# Patient Record
Sex: Male | Born: 1950 | Race: White | Hispanic: No | Marital: Married | Smoking: Current every day smoker
Health system: Southern US, Community
[De-identification: ages and names within clinical notes are randomized; demographics above are authoritative.]

## PROBLEM LIST (undated history)

## (undated) DIAGNOSIS — N200 Calculus of kidney: Secondary | ICD-10-CM

## (undated) DIAGNOSIS — K5792 Diverticulitis of intestine, part unspecified, without perforation or abscess without bleeding: Secondary | ICD-10-CM

## (undated) DIAGNOSIS — K8020 Calculus of gallbladder without cholecystitis without obstruction: Secondary | ICD-10-CM

## (undated) DIAGNOSIS — I82409 Acute embolism and thrombosis of unspecified deep veins of unspecified lower extremity: Secondary | ICD-10-CM

## (undated) DIAGNOSIS — I2699 Other pulmonary embolism without acute cor pulmonale: Secondary | ICD-10-CM

## (undated) HISTORY — PX: IVC FILTER PLACEMENT (ARMC HX): HXRAD1551

## (undated) HISTORY — PX: FOOT SURGERY: SHX648

---

## 2005-10-03 ENCOUNTER — Ambulatory Visit: Payer: Self-pay | Admitting: Podiatry

## 2006-11-03 ENCOUNTER — Emergency Department: Payer: Self-pay | Admitting: Emergency Medicine

## 2014-07-13 ENCOUNTER — Inpatient Hospital Stay: Payer: Self-pay | Admitting: Internal Medicine

## 2014-07-13 LAB — URINALYSIS, COMPLETE
Bacteria: NONE SEEN
Bilirubin,UR: NEGATIVE
GLUCOSE, UR: NEGATIVE mg/dL (ref 0–75)
Leukocyte Esterase: NEGATIVE
Nitrite: NEGATIVE
PH: 5 (ref 4.5–8.0)
Protein: 30
RBC,UR: 372 /HPF (ref 0–5)
SQUAMOUS EPITHELIAL: NONE SEEN
Specific Gravity: >1.06 (ref 1.003–1.030)
WBC UR: NONE SEEN /HPF (ref 0–5)

## 2014-07-13 LAB — CBC
HCT: 38.1 % — ABNORMAL LOW (ref 40.0–52.0)
HGB: 12.7 g/dL — ABNORMAL LOW (ref 13.0–18.0)
MCH: 29.6 pg (ref 26.0–34.0)
MCHC: 33.2 g/dL (ref 32.0–36.0)
MCV: 89 fL (ref 80–100)
PLATELETS: 224 10*3/uL (ref 150–440)
RBC: 4.27 10*6/uL — ABNORMAL LOW (ref 4.40–5.90)
RDW: 14.7 % — ABNORMAL HIGH (ref 11.5–14.5)
WBC: 10.4 10*3/uL (ref 3.8–10.6)

## 2014-07-13 LAB — COMPREHENSIVE METABOLIC PANEL
ALT: 24 U/L
ANION GAP: 7 (ref 7–16)
Albumin: 3.2 g/dL — ABNORMAL LOW (ref 3.4–5.0)
Alkaline Phosphatase: 125 U/L — ABNORMAL HIGH
BUN: 12 mg/dL (ref 7–18)
Bilirubin,Total: 1 mg/dL (ref 0.2–1.0)
CREATININE: 0.84 mg/dL (ref 0.60–1.30)
Calcium, Total: 8.4 mg/dL — ABNORMAL LOW (ref 8.5–10.1)
Chloride: 110 mmol/L — ABNORMAL HIGH (ref 98–107)
Co2: 25 mmol/L (ref 21–32)
EGFR (African American): >60
EGFR (Non-African Amer.): >60
GLUCOSE: 106 mg/dL — AB (ref 65–99)
Osmolality: 283 (ref 275–301)
Potassium: 3.9 mmol/L (ref 3.5–5.1)
SGOT(AST): 23 U/L (ref 15–37)
Sodium: 142 mmol/L (ref 136–145)
Total Protein: 6.8 g/dL (ref 6.4–8.2)

## 2014-07-13 LAB — PROTIME-INR
INR: 1
Prothrombin Time: 13 secs (ref 11.5–14.7)

## 2014-07-13 LAB — LIPASE, BLOOD: Lipase: 80 U/L (ref 73–393)

## 2014-07-13 LAB — TROPONIN I: Troponin-I: <0.02 ng/mL

## 2014-07-13 LAB — APTT: Activated PTT: 26.4 secs (ref 23.6–35.9)

## 2014-07-14 LAB — BASIC METABOLIC PANEL
ANION GAP: 6 — AB (ref 7–16)
BUN: 11 mg/dL (ref 7–18)
CO2: 27 mmol/L (ref 21–32)
Calcium, Total: 7.7 mg/dL — ABNORMAL LOW (ref 8.5–10.1)
Chloride: 109 mmol/L — ABNORMAL HIGH (ref 98–107)
Creatinine: 0.85 mg/dL (ref 0.60–1.30)
EGFR (African American): >60
EGFR (Non-African Amer.): >60
Glucose: 94 mg/dL (ref 65–99)
Osmolality: 282 (ref 275–301)
Potassium: 4 mmol/L (ref 3.5–5.1)
Sodium: 142 mmol/L (ref 136–145)

## 2014-07-14 LAB — CBC WITH DIFFERENTIAL/PLATELET
BASOS ABS: 0.1 10*3/uL (ref 0.0–0.1)
BASOS PCT: 0.6 %
BASOS PCT: 0.2 %
Basophil #: 0.1 10*3/uL (ref 0.0–0.1)
Basophil #: 0 10*3/uL (ref 0.0–0.1)
Basophil %: 0.6 %
Eosinophil #: 0.1 10*3/uL (ref 0.0–0.7)
Eosinophil #: 0.1 10*3/uL (ref 0.0–0.7)
Eosinophil #: 0.1 10*3/uL (ref 0.0–0.7)
Eosinophil %: 0.9 %
Eosinophil %: 1.5 %
Eosinophil %: 0.9 %
HCT: 33.5 % — AB (ref 40.0–52.0)
HCT: 33.5 % — ABNORMAL LOW (ref 40.0–52.0)
HCT: 32.8 % — ABNORMAL LOW (ref 40.0–52.0)
HGB: 11.3 g/dL — AB (ref 13.0–18.0)
HGB: 10.7 g/dL — ABNORMAL LOW (ref 13.0–18.0)
HGB: 10.6 g/dL — ABNORMAL LOW (ref 13.0–18.0)
LYMPHS PCT: 12.6 %
Lymphocyte #: 1.6 10*3/uL (ref 1.0–3.6)
Lymphocyte #: 1.2 10*3/uL (ref 1.0–3.6)
Lymphocyte #: 1.5 10*3/uL (ref 1.0–3.6)
Lymphocyte %: 16.2 %
Lymphocyte %: 14.6 %
MCH: 30.1 pg (ref 26.0–34.0)
MCH: 29.2 pg (ref 26.0–34.0)
MCH: 29.5 pg (ref 26.0–34.0)
MCHC: 33.7 g/dL (ref 32.0–36.0)
MCHC: 31.8 g/dL — AB (ref 32.0–36.0)
MCHC: 32.5 g/dL (ref 32.0–36.0)
MCV: 89 fL (ref 80–100)
MCV: 92 fL (ref 80–100)
MCV: 91 fL (ref 80–100)
MONOS PCT: 7.9 %
MONOS PCT: 7 %
Monocyte #: 0.8 x10 3/mm (ref 0.2–1.0)
Monocyte #: 0.6 x10 3/mm (ref 0.2–1.0)
Monocyte #: 0.8 x10 3/mm (ref 0.2–1.0)
Monocyte %: 6.8 %
NEUTROS ABS: 7.1 10*3/uL — AB (ref 1.4–6.5)
NEUTROS ABS: 6.2 10*3/uL (ref 1.4–6.5)
NEUTROS ABS: 9.2 10*3/uL — AB (ref 1.4–6.5)
NEUTROS PCT: 79.3 %
Neutrophil %: 74.4 %
Neutrophil %: 76.5 %
PLATELETS: 178 10*3/uL (ref 150–440)
Platelet: 173 10*3/uL (ref 150–440)
Platelet: 179 10*3/uL (ref 150–440)
RBC: 3.75 10*6/uL — ABNORMAL LOW (ref 4.40–5.90)
RBC: 3.66 10*6/uL — ABNORMAL LOW (ref 4.40–5.90)
RBC: 3.61 10*6/uL — ABNORMAL LOW (ref 4.40–5.90)
RDW: 14.7 % — AB (ref 11.5–14.5)
RDW: 15 % — ABNORMAL HIGH (ref 11.5–14.5)
RDW: 14.6 % — AB (ref 11.5–14.5)
WBC: 9.6 10*3/uL (ref 3.8–10.6)
WBC: 8.1 10*3/uL (ref 3.8–10.6)
WBC: 11.5 10*3/uL — ABNORMAL HIGH (ref 3.8–10.6)

## 2014-07-14 LAB — LIPID PANEL
Cholesterol: 136 mg/dL (ref 0–200)
HDL Cholesterol: 38 mg/dL — ABNORMAL LOW (ref 40–60)
LDL CHOLESTEROL, CALC: 83 mg/dL (ref 0–100)
TRIGLYCERIDES: 76 mg/dL (ref 0–200)
VLDL CHOLESTEROL, CALC: 15 mg/dL (ref 5–40)

## 2014-07-14 LAB — TROPONIN I: Troponin-I: <0.02 ng/mL

## 2014-07-14 LAB — HEMATOCRIT: HCT: 33.1 % — ABNORMAL LOW (ref 40.0–52.0)

## 2014-07-15 LAB — BASIC METABOLIC PANEL
ANION GAP: 8 (ref 7–16)
BUN: 7 mg/dL (ref 7–18)
Calcium, Total: 7.7 mg/dL — ABNORMAL LOW (ref 8.5–10.1)
Chloride: 109 mmol/L — ABNORMAL HIGH (ref 98–107)
Co2: 24 mmol/L (ref 21–32)
Creatinine: 0.73 mg/dL (ref 0.60–1.30)
EGFR (Non-African Amer.): >60
GLUCOSE: 73 mg/dL (ref 65–99)
Osmolality: 278 (ref 275–301)
POTASSIUM: 3.5 mmol/L (ref 3.5–5.1)
SODIUM: 141 mmol/L (ref 136–145)

## 2014-07-15 LAB — CBC WITH DIFFERENTIAL/PLATELET
BASOS ABS: 0 10*3/uL (ref 0.0–0.1)
Basophil %: 0.3 %
EOS ABS: 0.2 10*3/uL (ref 0.0–0.7)
Eosinophil %: 2.3 %
HCT: 31.3 % — ABNORMAL LOW (ref 40.0–52.0)
HGB: 10.5 g/dL — AB (ref 13.0–18.0)
LYMPHS ABS: 1.3 10*3/uL (ref 1.0–3.6)
Lymphocyte %: 16.9 %
MCH: 30.2 pg (ref 26.0–34.0)
MCHC: 33.7 g/dL (ref 32.0–36.0)
MCV: 90 fL (ref 80–100)
MONO ABS: 0.7 x10 3/mm (ref 0.2–1.0)
MONOS PCT: 8.6 %
NEUTROS ABS: 5.5 10*3/uL (ref 1.4–6.5)
Neutrophil %: 71.9 %
PLATELETS: 173 10*3/uL (ref 150–440)
RBC: 3.48 10*6/uL — ABNORMAL LOW (ref 4.40–5.90)
RDW: 14.9 % — ABNORMAL HIGH (ref 11.5–14.5)
WBC: 7.7 10*3/uL (ref 3.8–10.6)

## 2014-07-15 LAB — PSA: PSA: 0.4 ng/mL (ref 0.0–4.0)

## 2014-07-15 LAB — CEA: CEA: 1 ng/mL (ref 0.0–4.7)

## 2014-07-15 LAB — HEMATOCRIT: HCT: 32 % — ABNORMAL LOW (ref 40.0–52.0)

## 2014-07-15 LAB — CANCER ANTIGEN 19-9: CA 19 9: 8 U/mL (ref 0–35)

## 2014-07-16 LAB — BASIC METABOLIC PANEL
Anion Gap: 9 (ref 7–16)
BUN: 6 mg/dL — ABNORMAL LOW (ref 7–18)
CALCIUM: 7.8 mg/dL — AB (ref 8.5–10.1)
CHLORIDE: 108 mmol/L — AB (ref 98–107)
Co2: 24 mmol/L (ref 21–32)
Creatinine: 0.69 mg/dL (ref 0.60–1.30)
EGFR (Non-African Amer.): >60
GLUCOSE: 68 mg/dL (ref 65–99)
OSMOLALITY: 277 (ref 275–301)
POTASSIUM: 3.6 mmol/L (ref 3.5–5.1)
SODIUM: 141 mmol/L (ref 136–145)

## 2014-07-16 LAB — CBC WITH DIFFERENTIAL/PLATELET
BASOS ABS: 0 10*3/uL (ref 0.0–0.1)
BASOS PCT: 0.4 %
Eosinophil #: 0.3 10*3/uL (ref 0.0–0.7)
Eosinophil %: 3.9 %
HCT: 31.6 % — AB (ref 40.0–52.0)
HGB: 10.7 g/dL — ABNORMAL LOW (ref 13.0–18.0)
LYMPHS PCT: 20.3 %
Lymphocyte #: 1.4 10*3/uL (ref 1.0–3.6)
MCH: 30.1 pg (ref 26.0–34.0)
MCHC: 33.8 g/dL (ref 32.0–36.0)
MCV: 89 fL (ref 80–100)
MONO ABS: 0.6 x10 3/mm (ref 0.2–1.0)
Monocyte %: 9.2 %
NEUTROS ABS: 4.6 10*3/uL (ref 1.4–6.5)
NEUTROS PCT: 66.2 %
Platelet: 184 10*3/uL (ref 150–440)
RBC: 3.54 10*6/uL — AB (ref 4.40–5.90)
RDW: 14.5 % (ref 11.5–14.5)
WBC: 7 10*3/uL (ref 3.8–10.6)

## 2014-07-16 LAB — URINE CULTURE

## 2014-07-18 LAB — CULTURE, BLOOD (SINGLE)

## 2014-07-23 ENCOUNTER — Other Ambulatory Visit: Payer: Self-pay | Admitting: Surgery

## 2014-07-23 LAB — CBC WITH DIFFERENTIAL/PLATELET
BASOS ABS: 0.1 10*3/uL (ref 0.0–0.1)
Basophil %: 0.5 %
Eosinophil #: 0.2 10*3/uL (ref 0.0–0.7)
Eosinophil %: 1.9 %
HCT: 36.3 % — AB (ref 40.0–52.0)
HGB: 11.8 g/dL — ABNORMAL LOW (ref 13.0–18.0)
LYMPHS ABS: 1.1 10*3/uL (ref 1.0–3.6)
Lymphocyte %: 10 %
MCH: 29.1 pg (ref 26.0–34.0)
MCHC: 32.4 g/dL (ref 32.0–36.0)
MCV: 90 fL (ref 80–100)
MONOS PCT: 10.1 %
Monocyte #: 1.1 x10 3/mm — ABNORMAL HIGH (ref 0.2–1.0)
Neutrophil #: 8.4 10*3/uL — ABNORMAL HIGH (ref 1.4–6.5)
Neutrophil %: 77.5 %
PLATELETS: 396 10*3/uL (ref 150–440)
RBC: 4.05 10*6/uL — AB (ref 4.40–5.90)
RDW: 14.8 % — ABNORMAL HIGH (ref 11.5–14.5)
WBC: 10.8 10*3/uL — ABNORMAL HIGH (ref 3.8–10.6)

## 2014-07-23 LAB — COMPREHENSIVE METABOLIC PANEL
ALK PHOS: 97 U/L
ALT: 20 U/L
ANION GAP: 8 (ref 7–16)
Albumin: 3.5 g/dL (ref 3.4–5.0)
BUN: 9 mg/dL (ref 7–18)
Bilirubin,Total: 1.6 mg/dL — ABNORMAL HIGH (ref 0.2–1.0)
CALCIUM: 8.8 mg/dL (ref 8.5–10.1)
CO2: 27 mmol/L (ref 21–32)
Chloride: 103 mmol/L (ref 98–107)
Creatinine: 0.76 mg/dL (ref 0.60–1.30)
EGFR (African American): >60
EGFR (Non-African Amer.): >60
Glucose: 91 mg/dL (ref 65–99)
OSMOLALITY: 274 (ref 275–301)
Potassium: 3.6 mmol/L (ref 3.5–5.1)
SGOT(AST): 33 U/L (ref 15–37)
Sodium: 138 mmol/L (ref 136–145)
Total Protein: 7.8 g/dL (ref 6.4–8.2)

## 2014-07-26 ENCOUNTER — Ambulatory Visit: Payer: Self-pay | Admitting: Surgery

## 2014-10-04 ENCOUNTER — Ambulatory Visit: Payer: Self-pay | Admitting: Vascular Surgery

## 2014-10-08 ENCOUNTER — Ambulatory Visit: Payer: Self-pay | Admitting: Gastroenterology

## 2015-04-02 NOTE — Consult Note (Signed)
Brief Consult Note: Diagnosis: dvt, pe, sigmoid perforation/mass, anemia.   Patient was seen by consultant.   Comments: PATIENT SEN CHART REVIEWED DICTATED NOTE TO FOLLOW..POSSIBLE UNDERLYING  CANCER, HAS NEG PAST HX,MILD ANEMIA, CLOT AND PE...INFLAMMATORY LESION MAY HAVE PROMOTED CLOT, OR MALIGNAMCY, OR ACUIRED HYPERCOAGUABLE STATE, LESS LIKELY INHERITED COAGUABLE STATE, NO FAMILY HX OF MALIGNANCY OR CLOT.....Marland Kitchen.PLAN.Marland Kitchen.ACUTE MANAGMENT AS PER MEDICINE, VASCULAR, AND GENERAL SURGERY..   WILL LATER NEED F/U SCAN AND GI FOR COLONOSCOPY.  WOULD START W/U WITH CEA, CA 19/9, PSA, LUPUS INHIBITOR, IRON STUDIES. LATER, AS SOON AS OK WITH SURGERY, WOULD WANT TO GIVE THERAPEUTIC ANTICOAGULATION.  Electronic Signatures: Marin RobertsGittin, Robert G (MD)  (Signed 06-Aug-15 00:04)  Authored: Brief Consult Note   Last Updated: 06-Aug-15 00:04 by Marin RobertsGittin, Robert G (MD)

## 2015-04-02 NOTE — Discharge Summary (Signed)
PATIENT NAME:  Francisco FeelerHOBBY, Francisco Barnes MR#:  [id number] DATE OF BIRTH:  01-Jun-1951  DATE OF ADMISSION:  07/13/2014 DATE OF DISCHARGE:  07/18/2014  DISCHARGE DIAGNOSIS:  1.  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  2.  Pulmonary emboli.  3.  Perforated diverticulum.   DISCHARGE MEDICATIONS:  Aspirin 81 mg p.Barnes. daily, cranberry extract daily, fish oil 1 capsule b.i.d., Cipro 500 mg p.Barnes. b.i.d., Flagyl 500 mg every 8 hours, apixaban (Eliquis 5mg   2 tablets p.Barnes. 2 times daily for 7 days, then 5 mg tablet p.Barnes. b.i.d.  The patient initially needs to take 10 mg p.Barnes. b.i.d. for 1 week and after that 5 mg p.Barnes. b.i.d.  We discussed this and benefits of anticoagulation. The patient received Cipro for 10 days and also Flagyl for 2 weeks. The patient will follow up with Dr. Excell Seltzerooper as an outpatient in about 1 week regarding repeat CT abdomen.   LABORATORY DATA: The patient's electrolytes on admission: Sodium 142, potassium 3.9, chloride 110, bicarbonate 25, BUN 12, creatinine 0.8, glucose 106. LFTs within normal limits. INR is 1, white count is 10.4, hemoglobin 12.27, hematocrit 38.1, platelets 224,000.  Blood cultures are negative. Troponin is less than 0.02, white count 9.6, hemoglobin 11.3, hematocrit 33.8, platelets 173,000. The patient's lipids are within normal limits. Kidney function stayed stable. Hemoglobin stayed stable at 10.7.   Chest x-ray shows minute atelectasis in the left base bilaterally. CT angio chest shows small PE in the right lower lobe, negative aortic aneurysm or dissection. Left renal pelvis has 4-mm and 6-mm stones. The patient also has moderate ascites, high attenuation fluid in the pelvis, compatible with hemoperitoneum. Thickened sigmoid colon segment with edema in the surrounding mesenteric areass> bowel perforation or hemorrhage; diverticulitis is most likely.   The patient's urine showed 20,000 colonies of enterococci.   ct abdomen shows Moderate  ascites. Small liver with possible cirrhosis. No focal liver lesion however this is arterial scanning. Small calcified gallstone without gallbladder thickening. Spleen normal in size. Normal pancreas.  4 x 6 mm stone in the left renal pelvis with mild fullness of the renal pelvis. This is a potential cause of left flank pain. 2 mm nonobstructing stone left upper pole. No right renal calculi.  Negative for aortic dissection or aneurysm in the abdomen. Mild atherosclerotic disease. Iliac arteries are widely patent.  High attenuation fluid in the pelvis compatible with blood. There is a moderate amount of edema in the mesentery around the sigmoid colon. There are several small gas bubbles which appear extraluminal. Sigmoid colon appears somewhat thickened .This most likely is an area of sigmoid perforation and hemorrhage. This may be due to diverticulitis or other causes. Large left inguinal hernia containing sigmoid colon. The sigmoid colon is not obstructed or incarcerated.  Review of the MIP images confirms the above findings.  IMPRESSION: 1. Small pulmonary embolism right lower lobe 2. Negative for aortic aneurysm or dissection. 3. 4 x 6 mm stone left renal pelvis which could be a cause of left flank pain. Mild dilatation of the renal pelvis. 4. Moderate ascites. Small liver which could be due to us cirrhosis however the liver is not significantly irregular. Small gallstone 5. High attenuation fluid in the pelvis compatible with hemo peritoneum. Thickened sigmoid colon segment with edema in the surrounding mesenteries and extraluminal gas bubbles content compatible with perforation and hemorrhage. Diverticulitis most likely. Perforated tumor also a consideration. Surgical consultation recommended. Critical Value/emergent results were called by  telephone at the time of interpretation on 07/13/2014 at 7:24 pm to Dr. Sharman Cheek , who verbally acknowledged these results.,      The patient had a CT abdomen done again on August 5th, so CT abdomen is done on August 5th for hemoperitoneum followup. The patient's CT abdomen showed persistent changes of diverticulitis of  sigmoid colon. Hemoperitoneum is identified, fluid is increased in interval from prior study.   Patient had a leg ultrasound which showed patient has DVT in the right leg.   Patient's PSA levels are normal. CEA 99 level is 8.  Patient's CEA level is 1. Antiphospholipid antibodies syndrome negative.   HOSPITAL COURSE:  1.  The patient is a 64 year old male admitted because of abdominal pain and trouble breathing.  Look at history and physical for full details. The patient found to have a pulmonary embolism and also hemoperitoneum and he is admitted to medical service for abdominal pain and perforated diverticulitis.  Patient was seen by Dr. Michela Pitcher and they started him on antibiotics with Zosyn and he followed serial hemoglobins.  Patient's abdominal CT with p.Barnes. contrast showed persistent hemoperitoneum but patient did not have further abdominal pain and his symptoms improved, so we started him on liquid diet and gradually advanced to solid diet and Dr. Excell Seltzer to discharge him home with antibiotics. He will see him in the office in about 1 week regarding followup of CAT scan.  2.  The patient has pulmonary embolism and deep venous thrombosis. Initially patient had an inferior vena cava filter placed on August 5th by vascular because of deep vein thrombosis and patient was seen by Dr. Lorre Nick and we ordered a workup for hypercoagulable state including CA 99 and PSA. They are all negative. The patient started on Eliquis 1 day before discharge after discussing risks and benefits of anticoagulation. I discussed this with Dr. Excell Seltzer.  He agreed for anticoagulation, as well. Patient started on Eliquis and he will continue it for pulmonary embolism and patient will follow with Dr. Lorre Nick about 7 days and also per Dr.  Excell Seltzer in 7 days. The patient says he does not have any primary doctor and he will follow up with Dr. Excell Seltzer and Dr. Lorre Nick.   DISCHARGE VITAL SIGNS:  Temperature 98.3, heart rate 70, blood pressure 151/91, oxygen saturation 95% on room air.   TIME SPENT: More than 30 minutes.    ____________________________ Katha Hamming, MD sk:lt D: 07/19/2014 12:36:48 ET T: 07/19/2014 15:18:50 ET JOB#: [id number]  cc: Katha Hamming, MD, <Dictator> Katha Hamming MD ELECTRONICALLY SIGNED 07/27/2014 23:20

## 2015-04-02 NOTE — Consult Note (Signed)
CHIEF COMPLAINT and HISTORY:  Subjective/Chief Complaint abdominal pain, SOB   History of Present Illness Patient is admitted yesterday with worsening abdominal pain.  Also SOB and chest pain.  Surgery following for ruptured diverticula.  Also some concern for hemoperitoneum.  Found to have significant PE on CT scan.  Not going to be a candidate for anticoagulation due to his abdominal process, so we are asked to place an IVC filter.  No previous clotting history.  Awake, alert, and not short of breath at rest on [place] oxygen.   PAST MEDICAL/SURGICAL HISTORY:  Past Medical History:   DM:    HTN:   ALLERGIES:  Allergies:  No Known Allergies:   HOME MEDICATIONS:  Home Medications: Medication Instructions Status  Osteo Bi-Flex 200 mg-250 mg oral tablet 1 tab(s) orally 2 times a day Active  Fish Oil - oral capsule 1 cap(s) orally 2 times a day Active  Cranberry - oral tablet 1 tab(s) orally once a day Active  multivitamin 1 tab(s) orally once a day Active  aspirin 81 mg oral tablet 1 tab(s) orally once a day Active   Family and Social History:  Family History Non-Contributory   Social History positive  tobacco (Current within 1 year), positive ETOH   + Tobacco Current (within 1 year)   Place of Living Home   Review of Systems:  Fever/Chills No   Cough Yes   Sputum No   Abdominal Pain Yes   Diarrhea No   Constipation No   Nausea/Vomiting Yes   SOB/DOE Yes   Chest Pain Yes   Telemetry Reviewed NSR   Dysuria No   Tolerating PT Yes   Tolerating Diet No   Medications/Allergies Reviewed Medications/Allergies reviewed   Physical Exam:  GEN well developed, well nourished, in ICU.  Sitting up comfortably   HEENT pink conjunctivae, moist oral mucosa   NECK No masses  trachea midline   RESP normal resp effort  no use of accessory muscles   CARD regular rate  no JVD   VASCULAR ACCESS none   ABD positive tenderness  distended  hypoactive BS   GU foley  catheter in place  clear yellow urine draining   LYMPH negative neck, negative axillae   EXTR negative cyanosis/clubbing, negative edema   SKIN normal to palpation, skin turgor good   NEURO cranial nerves intact, motor/sensory function intact   PSYCH alert, A+O to time, place, person   LABS:  Laboratory Results: LabObservation:    05-Aug-15 10:04, [place] Color Flow Doppler Low Extrem Bilat (Legs)  OBSERVATION   Reason for Test pe  Hepatic:    04-Aug-15 16:45, Comprehensive Metabolic Panel  Bilirubin, Total 1.0  Alkaline Phosphatase 125  46-116  NOTE: New Reference Range  06/29/14  SGPT (ALT) 24  14-63  NOTE: New Reference Range  06/29/14  SGOT (AST) 23  Total Protein, Serum 6.8  Albumin, Serum 3.2  Routine Micro:    04-Aug-15 20:04, Blood Culture  Micro Text Report   BLOOD CULTURE    COMMENT                   NO GROWTH IN 8-12 HOURS     ANTIBIOTIC  Culture Comment   NO GROWTH IN 8-12 HOURS   Result(s) reported on 14 Jul 2014 at 04:00AM.  Micro Text Report   BLOOD CULTURE    COMMENT                   NO GROWTH IN  8-12 HOURS     ANTIBIOTIC  Culture Comment   NO GROWTH IN 8-12 HOURS   Result(s) reported on 14 Jul 2014 at 04:00AM.    04-Aug-15 20:04, Urine Culture  Micro Text Report   URINE CULTURE    COMMENT                   COLONIES TOO SMALL TO READ     ANTIBIOTIC  Specimen Source CC  Culture Comment   COLONIES TOO SMALL TO READ   Result(s) reported on 14 Jul 2014 at 11:05AM.  Routine Chem:    04-Aug-15 16:45, Comprehensive Metabolic Panel  Glucose, Serum 106  BUN 12  Creatinine (comp) 0.84  Sodium, Serum 142  Potassium, Serum 3.9  Chloride, Serum 110  CO2, Serum 25  Calcium (Total), Serum 8.4  Osmolality (calc) 283  eGFR (African American) >60  eGFR (Non-African American) >60  eGFR values <14m/min/1.73 m2 may be an indication of chronic  kidney disease (CKD).  Calculated eGFR is useful in patients with stable renal function.  The eGFR  calculation will not be reliable in acutely ill patients  when serum creatinine is changing rapidly. It is not useful in   patients on dialysis. The eGFR calculation may not be applicable  to patients at the low and high extremes of body sizes, pregnant  women, and vegetarians.  Anion Gap 7    04-Aug-15 16:45, Lipase  Lipase 80  Result(s) reported on 13 Jul 2014 at 05:54PM.    05-Aug-15 02:28, Lipid Profile ([facility]  Cholesterol, Serum 136  Triglycerides, Serum 76  HDL (INHOUSE) 38  VLDL Cholesterol Calculated 15  LDL Cholesterol Calculated 83  Result(s) reported on 14 Jul 2014 at 04:22AM.    05-Aug-15 098:33 Basic Metabolic Panel (w/Total Calcium)  Glucose, Serum 94  BUN 11  Creatinine (comp) 0.85  Sodium, Serum 142  Potassium, Serum 4.0  Chloride, Serum 109  CO2, Serum 27  Calcium (Total), Serum 7.7  Anion Gap 6  Osmolality (calc) 282  eGFR (African American) >60  eGFR (Non-African American) >60  eGFR values <647mmin/1.73 m2 may be an indication of chronic  kidney disease (CKD).  Calculated eGFR is useful in patients with stable renal function.  The eGFR calculation will not be reliable in acutely ill patients  when serum creatinine is changing rapidly. It is not useful in   patients on dialysis. The eGFR calculation may not be applicable  to patients at the low and high extremes of body sizes, pregnant  women, and vegetarians.  Cardiac:    04-Aug-15 16:45, Troponin I  Troponin I < 0.02  0.00-0.05  0.05 ng/mL or less: NEGATIVE   Repeat testing in 3-6 hrs   if clinically indicated.  >0.05 ng/mL: POTENTIAL   MYOCARDIAL INJURY. Repeat   testing in 3-6 hrs if   clinically indicated.  NOTE: An increase or decrease   of 30% or more on serial   testing suggests a   clinically important change    04-Aug-15 20:04, Troponin I  Troponin I < 0.02  0.00-0.05  0.05 ng/mL or less: NEGATIVE   Repeat testing in 3-6 hrs   if clinically indicated.  >0.05 ng/mL: POTENTIAL    MYOCARDIAL INJURY. Repeat   testing in 3-6 hrs if   clinically indicated.  NOTE: An increase or decrease   of 30% or more on serial   testing suggests a   clinically important change    05-Aug-15 02:28, Troponin I  Troponin I <  0.02  0.00-0.05  0.05 ng/mL or less: NEGATIVE   Repeat testing in 3-6 hrs   if clinically indicated.  >0.05 ng/mL: POTENTIAL   MYOCARDIAL INJURY. Repeat   testing in 3-6 hrs if   clinically indicated.  NOTE: An increase or decrease   of 30% or more on serial   testing suggests a   clinically important change  Routine UA:    04-Aug-15 20:04, Urinalysis  Color (UA) Amber  Clarity (UA) Hazy  Glucose (UA) Negative  Bilirubin (UA) Negative  Ketones (UA) Trace  Specific Gravity (UA) >1.060  Blood (UA) 3+  pH (UA) 5.0  Protein (UA) 30 mg/dL  Nitrite (UA) Negative  Leukocyte Esterase (UA) Negative  Result(s) reported on 13 Jul 2014 at 09:38PM.  RBC (UA) 372 /HPF  WBC (UA)   NONE SEEN  Bacteria (UA)   NONE SEEN  Epithelial Cells (UA)   NONE SEEN  Mucous (UA) PRESENT  Result(s) reported on 13 Jul 2014 at 09:38PM.  Routine Coag:    04-Aug-15 16:45, Activated PTT  Activated PTT (APTT) 26.4  A HCT value >55% may artifactually increase the APTT. In one study,  the increase was an average of 19%.  Reference: "Effect on Routine and Special Coagulation Testing Values  of Citrate Anticoagulant Adjustment in Patients with High HCT Values."  American Journal of Clinical Pathology 2006;126:400-405.    04-Aug-15 16:45, Prothrombin Time  Prothrombin 13.0  INR 1.0  INR reference interval applies to patients on anticoagulant therapy.  A single INR therapeutic range for coumarins is not optimal for all  indications; however, the suggested range for most indications is  2.0 - 3.0.  Exceptions to the INR Reference Range may include: Prosthetic heart  valves, acute myocardial infarction, prevention of myocardial  infarction, and combinations of aspirin and  anticoagulant. The need  for a higher or lower target INR must be assessed individually.  Reference: The Pharmacology and Management of the Vitamin K   antagonists: the seventh ACCP Conference on Antithrombotic and  Thrombolytic Therapy. RKYHC.6237 Sept:126 (3suppl): [id number].  A HCT value >55% may artifactually increase the PT.  In one study,   the increase was an average of 25%.  Reference:  "Effect on Routine and Special Coagulation Testing Values  of Citrate Anticoagulant Adjustment in Patients with High HCT Values."  American Journal of Clinical Pathology 2006;126:400-405.  Routine Hem:    04-Aug-15 16:45, Hemogram, Platelet Count  WBC (CBC) 10.4  RBC (CBC) 4.27  Hemoglobin (CBC) 12.7  Hematocrit (CBC) 38.1  Platelet Count (CBC) 224  Result(s) reported on 13 Jul 2014 at 05:52PM.  MCV 89  MCH 29.6  MCHC 33.2  RDW 14.7    05-Aug-15 02:28, CBC Profile  WBC (CBC) 9.6  RBC (CBC) 3.75  Hemoglobin (CBC) 11.3  Hematocrit (CBC) 33.5  Platelet Count (CBC) 173  MCV 89  MCH 30.1  MCHC 33.7  RDW 14.7  Neutrophil % 74.4  Lymphocyte % 16.2  Monocyte % 7.9  Eosinophil % 0.9  Basophil % 0.6  Neutrophil # 7.1  Lymphocyte # 1.6  Monocyte # 0.8  Eosinophil # 0.1  Basophil # 0.1  Result(s) reported on 14 Jul 2014 at 03:18AM.    05-Aug-15 07:01, CBC Profile  WBC (CBC) 8.1  RBC (CBC) 3.66  Hemoglobin (CBC) 10.7  Hematocrit (CBC) 33.5  Platelet Count (CBC) 178  MCV 92  MCH 29.2  MCHC 31.8  RDW 15.0  Neutrophil % 76.5  Lymphocyte % 14.6  Monocyte % 6.8  Eosinophil % 1.5  Basophil % 0.6  Neutrophil # 6.2  Lymphocyte # 1.2  Monocyte # 0.6  Eosinophil # 0.1  Basophil # 0.1  Result(s) reported on 14 Jul 2014 at 07:22AM.    05-Aug-15 12:01, CBC Profile  WBC (CBC) 11.5  RBC (CBC) 3.61  Hemoglobin (CBC) 10.6  Hematocrit (CBC) 32.8  Platelet Count (CBC) 179  MCV 91  MCH 29.5  MCHC 32.5  RDW 14.6  Neutrophil % 79.3  Lymphocyte % 12.6  Monocyte % 7.0  Eosinophil % 0.9   Basophil % 0.2  Neutrophil # 9.2  Lymphocyte # 1.5  Monocyte # 0.8  Eosinophil # 0.1  Basophil # 0.0  Result(s) reported on 14 Jul 2014 at 12:41PM.   RADIOLOGY:  Radiology Results: XRay:    04-Aug-15 17:12, Chest Portable Single View  Chest Portable Single View  REASON FOR EXAM:    CP  COMMENTS:       PROCEDURE: DXR - DXR PORTABLE CHEST SINGLE VIEW  - Jul 13 2014  5:12PM     CLINICAL DATA:  Chest pain and shortness of breath.  Abdominal pain.    EXAM:  PORTABLE CHEST - 1 VIEW    COMPARISON:  None.    FINDINGS:  Heart size and pulmonary vascularity are normal. There is minimal  atelectasis at the left base laterally. The lungs are otherwise  clear. No acute osseous abnormality.     IMPRESSION:  Minimal linear atelectasis at the left base laterally.      Electronically Signed    By: Rozetta Nunnery M.D.    On: 07/13/2014 17:26         Verified By: Larey Seat, M.D.,  [place]:    05-Aug-15 10:04, [place] Color Flow Doppler Low Extrem Bilat (Legs)  [place] Color Flow Doppler Low Extrem Bilat (Legs)  REASON FOR EXAM:    pe  COMMENTS:       PROCEDURE: [place]  - US DOPPLER LOW EXTR BILATERAL  - Jul 14 2014 10:04AM     CLINICAL DATA:  Pulmonary embolism. Evaluate for lower extremity  deep vein thrombosis.    EXAM:  BILATERAL LOWER EXTREMITY VENOUS DOPPLER ULTRASOUND    TECHNIQUE:  Gray-scale sonography with graded compression, as well as color  Doppler and duplex ultrasound were performed to evaluate the lower  extremity deep venous systems from the level of the common femoral  vein and including the common femoral, femoral, profunda femoral,  popliteal and calf veins including the posterior tibial, peroneal  and gastrocnemius veins when visible. The superficial great  saphenous vein was also interrogated. Spectral Doppler was utilized  to evaluate flow at rest and with distal augmentation maneuvers in  the common femoral, femoral and popliteal veins.    COMPARISON:   None.    FINDINGS:  RIGHT LOWER EXTREMITY    Normal compressibility, augmentation and color Doppler flow in the  right common femoral vein and within the proximal and mid right  femoral vein. The distal right femoral vein is poorly visualized.  The right profunda femoral vein is patent. Right saphenofemoral  junction is patent. Right great saphenous vein is patent.    The right popliteal vein may be duplicated or have an early  bifurcation. One of the popliteal veins contains echogenic thrombus  and this vessel is not compressible. Findings consistent with a  popliteal vein thrombus. Visualized right calf veins are patent.    LEFT LOWER EXTREMITY    Normal compressibility, augmentation and  color Doppler flow in the  left common femoral vein, left femoral vein and left popliteal vein.  Left saphenofemoral junction is patent. Visualized left great  saphenous vein is patent. Visualized left calf veins are patent.   IMPRESSION:  Positive for deep vein thrombosis in the right lower extremity.  There is thrombus within a right popliteal vein.    Negative for left lower extremity deep vein thrombosis.      Electronically Signed    By: Markus Daft M.D.    On: 07/14/2014 10:27         Verified By: Burman Riis, M.D.,  LabUnknown:    04-Aug-15 17:12, Chest Portable Single View  PACS Image    04-Aug-15 18:41, CT Angiography Chest/Abd/Pelvis w/wo  PACS Image    05-Aug-15 08:47, CT Abdomen and Pelvis With Contrast  PACS Image    05-Aug-15 10:04, [place] Color Flow Doppler Low Extrem Bilat (Legs)  PACS Image  CT:    04-Aug-15 18:41, CT Angiography Chest/Abd/Pelvis w/wo  CT Angiography Chest/Abd/Pelvis w/wo  REASON FOR EXAM:    chest/back/abdomen pain. eval dissection  COMMENTS:       PROCEDURE: CT  - CT ANGIOGRAPHY CHEST/ABD/PELVIS  - Jul 13 2014  6:41PM     CLINICAL DATA:  Chest pain. Back pain. Abdomen pain. Rule out aortic  dissection    EXAM:  CT ANGIOGRAPHY CHEST, ABDOMEN  AND PELVIS    TECHNIQUE:  Multidetector CT imaging through the chest, abdomen and pelvis was  performed using the standard protocol during bolus administration of  intravenous contrast. Multiplanar reconstructed images and MIPs were  obtained and reviewed to evaluate the vascular anatomy.    CONTRAST:  125 mL Isovue 370 IV    COMPARISON:  None.    FINDINGS:  CTA CHEST FINDINGS    Small filling defect right lower lobe pulmonary artery compatible  with pulmonary embolism. Noother pulmonary emboli. No evidence of  right heart strain.    Negative for aortic dissection or aneurysm. Heart size is normal. No  pericardial effusion.  Right upper lobe calcified granuloma has a benign appearance.  Negative for pneumonia or effusion. No mass lesion or adenopathy in  the chest.    Review of the MIP images confirms the above findings.    CTA ABDOMEN AND PELVIS FINDINGS    Moderate ascites. Small liver with possible cirrhosis. No focal  liver lesion however this is arterial scanning. Small calcified  gallstone without gallbladder thickening. Spleen normal in size.  Normal pancreas.    4 x 6 mm stone in the left renal pelvis with mild fullness of the  renal pelvis. This is a potential cause of left flank pain. 2 mm  nonobstructing stone left upper pole. No right renal calculi.    Negative for aortic dissection or aneurysm in the abdomen. Mild  atherosclerotic disease. Iliac arteries are widely patent.    High attenuation fluid in the pelvis compatible with blood. There is  a moderate amount of edema in the mesentery around the sigmoid  colon. There are several small gas bubbles which appear  extraluminal. Sigmoid colon appears somewhat thickened .This most  likely is an area of sigmoid perforation and hemorrhage. Thismay be  due to diverticulitis or other causes. Large left inguinal hernia  containing sigmoid colon. The sigmoid colon is not obstructed or  incarcerated.    Review of  the MIP images confirms the above findings.   IMPRESSION:  1. Small pulmonary embolism right lower lobe  2. Negative for aortic aneurysm or dissection.  3. 4 x 6 mm stone left renal pelvis which could be a cause of left  flank pain. Mild dilatation of the renal pelvis.  4. Moderate ascites. Small liver which could be due to [place] cirrhosis  however the liver is not significantly irregular. Small gallstone  5. High attenuation fluid in the pelvis compatible with hemo  peritoneum. Thickened sigmoid colon segment with edema in the  surrounding mesenteries and extraluminal gas bubblescontent  compatible with perforation and hemorrhage. Diverticulitis most  likely. Perforated tumor also a consideration. Surgical consultation  recommended.  Critical Value/emergent results were called by telephone at the time  of interpretation on 07/13/2014 at 7:24 pm to Dr. Carrie Mew ,  who verbally acknowledged these results.      Electronically Signed    By: Franchot Gallo M.D.    On: 07/13/2014 19:24         Verified By: Truett Perna, M.D.,    05-Aug-15 08:47, CT Abdomen and Pelvis With Contrast  CT Abdomen and Pelvis With Contrast  REASON FOR EXAM:    (1) hemoperitoneum; (2) hemoperitoneum  COMMENTS:       PROCEDURE: CT  - CT ABDOMEN / PELVIS  W  - Jul 14 2014  8:47AM     CLINICAL DATA:  Known hemoperitoneum    EXAM:  CT ABDOMEN AND PELVIS WITH CONTRAST    TECHNIQUE:  Multidetector CT imaging of the abdomen and pelvis was performed  using the standard protocol following bolus administration of  intravenous contrast.  CONTRAST:  100 mL Isovue 370.    COMPARISON:  07/13/2014    FINDINGS:  The lung bases demonstrate mild atelectatic changes. No focal  infiltrate is seen.    The liver and spleen are within normal limits. The gallbladder  demonstrates a single dependent gallstone without complicating  factors. The adrenal glands and pancreas are unremarkable. The  kidneys are well  visualized bilaterally. Nonobstructing renal  calculi are again noted on the left. The overall appearance is  stable. The bladder is well distended with non-opacified urine. The  appendix is within normal limits. No acute bony abnormality is seen.  The degree of free fluid within the abdomen particularly surrounding  the liver has increased in the interval from the prior exam. It  persists with increased attenuation particularly in the pelvis  consistent with hemorrhage. There remains inflammatory change of the  sigmoid colon with diverticulitis. The overall appearance is stable  from the prior study. Small bubbles of extra luminal air all are  again identified. No significant free air is seen. The fat  containing left inguinal hernia with sigmoid colon within is again  noted. Again no incarceration or obstructive changes are noted. No  areas of active extravasation are seen.     IMPRESSION:  Persistent changes of diverticulitis in the sigmoid colon.    Hemoperitoneum is again identified although the degree of fluid  surrounding the liver appears to have increased in the interval from  the prior study. No active extravasation is noted.    The remainder of the exam is stable from the previous day.      Electronically Signed    By: Inez Catalina M.D.    On: 07/14/2014 09:01         Verified By: Everlene Farrier, M.D.,   ASSESSMENT AND PLAN:  Assessment/Admission Diagnosis PE ruptured diverticuli and possible hemoperitoneum.  Unable to be currently anticoagulated tobacco dependence.  DM HTN   Plan Would agree with IVC filter placement at this time.   If felt safe to begin anticoagulation going forward, would start and continue for six months. Will place retrievable IVC filter and plan removal in several months risks and benefits discussed with patient and son   level 3   Electronic Signatures: Algernon Huxley (MD)  (Signed 05-Aug-15 14:14)  Authored: Chief Complaint and  History, PAST MEDICAL/SURGICAL HISTORY, ALLERGIES, HOME MEDICATIONS, Family and Social History, Review of Systems, Physical Exam, LABS, RADIOLOGY, Assessment and Plan   Last Updated: 05-Aug-15 14:14 by Algernon Huxley (MD)

## 2015-04-02 NOTE — Op Note (Signed)
PATIENT NAME:  Francisco Barnes, Francisco Barnes MR#:  [id number] DATE OF BIRTH:  August 07, 1951  DATE OF PROCEDURE:  10/04/2014  PREOPERATIVE DIAGNOSIS: Deep venous thrombosis and pulmonary embolus, status post inferior vena cava filter.   POSTOPERATIVE DIAGNOSIS: Deep venous thrombosis and pulmonary embolus, status post inferior vena cava filter.   PROCEDURES: 1.  Ultrasound guidance for vascular access to right jugular vein.  2.  Catheter placement into inferior vena cava.  3.  Inferior venacavogram.  4.  Retrieval of Bard [place] inferior vena cava filter.   SURGEON: Annice NeedyJason S. Osmel Dykstra, MD   ANESTHESIA: Local with moderate conscious sedation.   BLOOD LOSS: Minimal.   INDICATION FOR PROCEDURE: This is a 64 year old gentleman with IVC filter that was placed about 2-3 months ago for DVT and PE. He has been tolerating anticoagulation and done well with no current acute DVT on ultrasound. We elected to retrieve his filter due to the small risks of filter-related complications going forward. Risks and benefits were discussed. Informed consent was obtained.   DESCRIPTION OF PROCEDURE: The patient is brought to the vascular suite. Right neck was sterilely prepped and draped and a sterile surgical field was created. The right jugular vein was visualized with ultrasound and found to be widely patent. It was then accessed under direct ultrasound guidance without difficulty with a Seldinger needle. A J-wire was then placed. After skin nick and dilatation, a 12 French sheath was placed into the inferior vena cava. Over the J-wire, inferior venacavogram was performed which showed a patent IVC with filter in excellent location. I then used a gooseneck snare to snare the hook on the top of the filter and advanced a sheath over the filter collapsing it into the sheath, removing it in its entirety. The sheath was then removed. Pressure was held. Sterile dressing was placed. The patient tolerated the procedure well.      ____________________________ Annice NeedyJason S. Keenen Roessner, MD jsd:TT D: 10/04/2014 12:35:12 ET T: 10/04/2014 20:20:39 ET JOB#: [id number]  cc: Annice NeedyJason S. [address] Way, MD, <Dictator> Annice NeedyJASON S Karlton Maya MD ELECTRONICALLY SIGNED 10/07/2014 15:28

## 2015-04-02 NOTE — Consult Note (Signed)
PATIENT NAME:  Francisco Barnes, FREDELL MR#:  [id number] DATE OF BIRTH:  December 30, 1950  DATE OF CONSULTATION:  07/13/2014  CONSULTING PHYSICIAN:  Quentin Ore III, MD  PRIMARY CARE PHYSICIAN: Dr. Barry Brunner.   ADMITTING PHYSICIAN: Prime doctor.  CHIEF COMPLAINT: Left lower quadrant suprapubic abdominal pain.   HISTORY OF PRESENT ILLNESS: Francisco Barnes is a 64 year old gentleman who was in his usual state of good health until early this morning. He woke normally and went to a fast food restaurant for breakfast and then developed significant left lower quadrant suprapubic abdominal pain. The pain was constant with increasing severity. He felt that he could not go to work and went home. The pain began to worsen and he developed some shoulder and back pain. Denied any GI symptoms. He had no nausea, vomiting, diarrhea, constipation. The pain increased over the course of the afternoon and with his back pain he was concerned and presented to the Emergency Room for further evaluation. Work-up in the Emergency Room revealed no evidence of any hypertension or no tachycardia or fever. He underwent laboratory evaluation which demonstrated white blood cell count of 10,000, hemoglobin of nearly 13, normal platelet count, normal electrolytes and liver function studies, normal lipase. Troponin was normal. CT angiogram was performed which demonstrated a small right lower lobe pulmonary embolus. No other significant chest abnormality was identified. Specifically, no dissection or aortic disruption was identified. Abdominal CT scan was performed without p.o. contrast. There was a moderate amount of intra-abdominal ascites, some significant swelling around the sigmoid colon with some extraluminal air and fluid and some possible blood in the distal peritoneum and the pelvis. The surgical service was consulted.   PAST MEDICAL HISTORY: He has no significant medical history. He denies history of hepatitis, yellow jaundice, pancreatitis,  peptic ulcer disease, gallbladder disease or previous diagnosis of diverticulitis. He has no previous abdominal surgery. He has a known left inguinal hernia. He has had no major medical problems including no history of cardiac disease, hypertension, diabetes or thyroid problems.   FAMILY HISTORY: He does have a family history of diabetes and hypertension.   SOCIAL HISTORY: He is a long-standing cigarette smoker, smoking a pack to a pack and a half of cigarettes a day. He does have a history of significant alcohol use in the past and drinks on a daily basis although not to excess currently.   MEDICATIONS: He takes no medications other than vitamins.  ALLERGIES: He has no medical allergies.   REVIEW OF SYSTEMS: Otherwise unremarkable. Specifically denies any urinary tract symptoms.   PHYSICAL EXAMINATION:  GENERAL: He is an alert and comfortable gentleman with significant improvement in his pain.  VITAL SIGNS: Blood pressure 130/75. Heart rate is 90 and regular. He is afebrile.  HEENT: Reveals a sallow complected gentleman, but no evidence of any pupillary abnormalities, facial deformities or scleral icterus.  NECK: Supple, nontender with midline trachea.  CHEST: Clear with no adventitious sounds, and he has normal pulmonary excursion.  CARDIAC: No murmurs or gallops to my ear and he seems to be in normal sinus rhythm.  ABDOMEN: Soft with some mild distension. He does have some generalized left lower quadrant tenderness, but no mass, no rebound, no guarding. He has hyperactive bowel sounds. He has a large left inguinal hernia which does not appear obstructive, although it is very difficult to completely reduce.  EXTREMITIES: Lower extremity exam reveals full range of motion. No deformities. Good distal pulses.  PSYCHIATRIC: Reveals mild depression, normal orientation.  I have independently reviewed his CT scan. He does have a left lower abdominal kidney stone which could be responsible for some  of this abdominal pain. CT is a little atypical for perforated diverticulitis with questionable intraperitoneal blood, moderate ascites, small liver and then there is a question of pulmonary embolus.   IMPRESSION: At the present time, he does not appear to have clear-cut diverticulitis. I discussed the case with the ED doctor and the internal medicine physicians. I would recommend admission to the medical service with IV antibiotics. Further follow-up. Serial examinations, follow up on his white blood cell count and hemoglobin. I am a little concerned about his nephrolithiasis. He also has history of some lower extremity numbness which I do not have an etiology for at the present time. It may be useful repeat a CT scan with contrast in the GI tract to better evaluate the sigmoid colon. The CT scan finding with the bleeding could be related to possible colon cancer in addition. Intraluminal examination by GI may be of some benefit despite what will be their reluctance to perform such study because of the possible perforation. We will continue to follow him. I do not see any acute surgical indication at the present time.    ____________________________ Carmie Endalph L. Ely III, MD rle:jh D: 07/13/2014 21:11:56 ET T: 07/14/2014 01:33:01 ET JOB#: [id number]  cc: Carmie Endalph L. Ely III, MD, <Dictator> [facility] [place] Quentin OreALPH L ELY MD ELECTRONICALLY SIGNED 07/14/2014 19:16

## 2015-04-02 NOTE — H&P (Signed)
PATIENT NAME:  Francisco Barnes, ELVIN MR#:  [id number] DATE OF BIRTH:  [birth year]  DATE OF ADMISSION:  07/13/2014  REFERRING PHYSICIAN:  Dr. Scotty Court.  PRIMARY CARE PHYSICIAN: Dr. Beckey Downing though he said that he does not follow closely.    CHIEF COMPLAINT: Abdominal pain and shortness of breath.    A 64 year old gentleman with history of hypertension presenting with abdominal pain and shortness of breath.  He describes one day duration left lower quadrant abdominal pain. Gradually worsened throughout the day, worse with activity, nonradiating. He describes it only as "pain" Intensity got up to about 8 to 9 out of 10 and currently is asymptomatic with his abdominal pain although his pain was worse enough today that he actually had to leave work and had difficulty getting into his care secondary to pain. While walking from his car to his house, his pain in the abdomen improved, but he then noted dyspnea on exertion. There was shortness of mainly described as dyspnea on exertion. Denies any chest pain or coughing at that time. Shortly after the episode of shortness of breath he developed right shoulder pain 7 to 8 out of 10 in intensity, intermittent, sharp in quality, radiating down the right side to the mid axillary line to the right flank. No worsening or relieving factors. Once again, no fevers, chills, no further symptomatology. With the above symptoms, he called his son who advised him to call EMS and thus he was sent to the hospital for further work-up and evaluation. In the Emergency Department, he had a CAT scan performed through initial evaluation noted to have a right-sided pulmonary embolus as well as perforated diverticula with hemoperitoneum. Currently, he states that his abdominal pain is greatly improved. He does complain of some shoulder pain; however, the intensivist is markedly decreased right around 2 to 3 out of 10.   REVIEW OF SYSTEMS: CONSTITUTIONAL: Denies fever, fatigue, weakness.  EYES:  Denies blurred vision, double vision or eye pain.  EARS, NOSE, THROAT: Denies tinnitus, ear pain, hearing loss.  RESPIRATORY: Denies cough, wheeze. Positive for shortness of breath as described above.  CARDIOVASCULAR: Denies chest pain, palpitations, or edema.  GASTROINTESTINAL: Denies nausea, vomiting, or diarrhea. Positive for abdominal pain as described above.  GENITOURINARY: Denies dysuria, hematuria.  ENDOCRINE: Denies nocturia or thyroid problems. HEMATOLOGIC: Denies easy bruising, bleeding.  SKIN: Denies rashes or lesions.  MUSCULOSKELETAL: Denies pain in neck, back, knees, hips. Positive for shoulder pain as described above symptoms. Denies arthritic symptoms.  NEUROLOGIC: Denies paralysis, paresthesias.  PSYCHIATRIC: Denies anxiety or depressive symptoms.  Otherwise, full review of systems performed by me is negative.   PAST MEDICAL HISTORY: Hypertension.   SOCIAL HISTORY: Positive for every day tobacco as well as everyday alcohol use. States he drinks about three beers daily and he denies ever having any withdrawal symptoms. Denies any drug use.   FAMILY HISTORY: Positive for diabetes.   ALLERGIES: No known drug allergies.   HOME MEDICATIONS: Include aspirin 81 mg p.o. daily, cranberry 1 tab p.o. daily, fish oil 1 tab p.o. b.i.d., Osteo Bi-Flex 200/250 mg p.o. b.i.d.   PHYSICAL EXAMINATION:  VITAL SIGNS: Temperature 98.2, heart rate 75, respirations 18, blood pressure 144/87, saturating 97% on room air.  GENERAL: Well-nourished, well-developed, Caucasian gentleman surprisingly in no acute distress.  HEAD: Normocephalic, atraumatic.  EYES: Pupils equal, round, reactive to light. Extraocular muscles intact. No scleral icterus.  MOUTH: Moist mucosal membranes. Dentition poor. No abscess noted.  EARS, NOSE AND THROAT: Clear without exudates. No external  lesions.  NECK: Supple. No thyromegaly. No nodules. No JVD.  PULMONARY: Clear to auscultation bilaterally with diminished  breath sounds throughout all lung fields secondary to  poor respiratory effort as described previously.  CHEST: Nontender to palpation.  CARDIOVASCULAR: S1, S2, regular rate and rhythm. No murmurs, rubs, or gallops. No edema. Pedal pulses 2+ bilaterally.  GASTROINTESTINAL: Soft, nondistended minimal tenderness in the left lower quadrant. Hypoactive bowel sounds. No Grey-Turner sign.   NEUROLOGIC: Cranial nerves II through XII intact. No gross focal neurological deficits. Sensation intact. Reflexes intact.  SKIN: No ulceration, lesions, rash, cyanosis. Skin warm, dry. Turgor intact.  MUSCULOSKELETAL: No swelling, clubbing, or edema. Range of motion full in all extremities.  SKIN: No ulceration, lesions, rashes, or cyanosis. Skin warm, dry. Turgor intact.  PSYCHIATRIC: Mood and affect within normal limits. Patient awake, alert and oriented x 3. Insight and judgment intact.   LABORATORY DATA: Radiographic imaging: Chest x-ray minimal linear atelectasis of the left base. CT abdomen and pelvis performed revealing a small pulmonary embolus in the right lower lobe 4 x 6 mm stone left renal pelvis with mild dilatation of the renal pelvis, moderate ascites, small liver. High attenuation, fluid in the pelvis, compatible with hemoperitoneum as well as thickened sigmoid colon segment with edema surrounding mesentery and extraluminal gas bubbles consistent with perforation and hemorrhage, diverticulitis most likely. Perforated tumor also consideration. Remainder of laboratory data: Sodium 142, potassium 3.9, chloride 110, bicarbonate 25, BUN 12, creatinine 0.84, glucose 106. LFTs: Albumin 3.2, alkaline phosphatase 125, (Dictation Anomaly) <MISSING TEXT>  within normal limits. Troponin less than 0.0-2 x 2. WBC 10.4, hemoglobin 12.7, platelets of 224,000. Urinalysis, RBCs 372; however, no evidence of infection.   ASSESSMENT AND PLAN: A 64 year old Caucasian gentleman with history of hypertension, presenting with  abdominal pain, shortness of breath subsequently found to have a right-sided pulmonary embolus as well as perforated diverticula with hemoperitoneum.  1. Acute pulmonary embolus. He has no discernible risk factors, no immobilization. We will hold anticoagulation given hemoperitoneum, will check bilateral lower extremity Dopplers as well as provide DuoNeb treatments for shortness of breath and add incentive spirometer.  2. Perforated diverticula. Case discussed at length with Dr. Michela PitcherEly of surgery and no surgery for now. Complete blood count q. 6 hours, antibiotic coverage with Zosyn. CT abdomen with p.o. contrast in the morning. If abdominal pain worsens or there is new symptomatology we will place Foley, check intra-abdominal pressure. If elevated, will discuss the case with surgery again throughout the evening. Also trend CBCs q. 6 hours.  3. Alcohol abuse. Will initiate a CIWA protocol.  4. Venous thromboembolism prophylaxis. Sequential compression devices.   CODE STATUS: The patient is full code.   CRITICAL CARE TIME SPENT: 55 minutes.    ____________________________ Cletis Athensavid K. Hower, MD dkh:jh D: 07/13/2014 21:50:06 ET T: 07/13/2014 22:27:38 ET JOB#: [id number]  cc: Cletis Athensavid K. Hower, MD, <Dictator> DAVID Synetta ShadowK HOWER MD ELECTRONICALLY SIGNED 07/14/2014 0:18

## 2015-04-02 NOTE — Op Note (Signed)
PATIENT NAME:  Francisco FeelerHOBBY, Francisco Barnes MR#:  [id number] DATE OF BIRTH:  [birth year]  DATE OF PROCEDURE:  07/14/2014  PREOPERATIVE DIAGNOSES:   1.  Pulmonary embolus.  2.  Perforated diverticula with possible hemoperitoneum.  3.  Hypertension.  4.  Diabetes.  5.  Tobacco dependence.   POSTOPERATIVE DIAGNOSES:   1.  Pulmonary embolus.  2.  Perforated diverticula with possible hemoperitoneum.  3.  Hypertension.  4.  Diabetes.  5.  Tobacco dependence.   PROCEDURES PERFORMED: 1.  Ultrasound guidance for vascular access to right femoral vein.   2.  Catheter placement into inferior vena cava.  3.  Inferior venacavogram.  4.  Placement of a Bard Denali inferior venae cavae filter.   SURGEON:  Annice NeedyJason S Dew, MD     ANESTHESIA:  Local with moderate conscious sedation.    ESTIMATED BLOOD LOSS:  Minimal.   FLUOROSCOPY TIME:  Less than 1 minute.   CONTRAST USED:  Of 15 mL of Visipaque.    INDICATION FOR PROCEDURE:  This is a 64 year old gentleman who was admitted yesterday with severe abdominal pain and shortness of breath. He was found to have both a pulmonary embolus and a severe intra-abdominal process with possible hemoperitoneum and a possible ruptured diverticulum. He is not a candidate for anticoagulation due to his intra-abdominal process and we are asked to place a filter due to his pulmonary embolus.  Risks and benefits were discussed. Informed consent was obtained.   DESCRIPTION OF PROCEDURE:  The patient was brought to the vascular suite. The skin is sterilely prepped and draped and a sterile surgical field was created. The Right femoral vein was accessed under direct ultrasound guidance without difficulty with a Seldinger needle and a J-wire was then placed. After skin nick and dilatation, the delivery sheath was placed into the inferior vena cava and an inferior venacavogram was performed. This demonstrated a patent IVC with the level of the renal veins at L1. The filter was then deployed  into the inferior vena cava at the level of The L1-L2 interspace.  The delivery sheath was then removed. Pressure was held. Sterile dressing was placed. The patient tolerated the procedure well and was taken to the recovery room in stable condition.     ____________________________ Annice NeedyJason S. Dew, MD jsd:nt D: 07/14/2014 14:33:34 ET T: 07/14/2014 14:50:46 ET JOB#: [id number]  cc: Annice NeedyJason S. Dew, MD, <Dictator> Annice NeedyJASON S DEW MD ELECTRONICALLY SIGNED 07/21/2014 14:27

## 2015-06-10 IMAGING — CT CT ANGIO CHEST-ABD-PELV
2 of 7 series · 12 of 36 positions shown, 17 images · IV contrast (APPLIED)
Comparison: None.

CLINICAL DATA: Chest pain. Back pain. Abdomen pain. Rule out aortic
dissection

EXAM:
CT ANGIOGRAPHY CHEST, ABDOMEN AND PELVIS
TECHNIQUE: Multidetector CT imaging through the chest, abdomen and pelvis was
performed using the standard protocol during bolus administration of
intravenous contrast. Multiplanar reconstructed images and MIPs were
obtained and reviewed to evaluate the vascular anatomy.
CONTRAST:  125 mL Isovue 370 IV

[Series 6: arterial · axial · arterial · 0.85mm/px · z∈[-957,-383]mm · 11 of 341 slices shown, 15 images]
[im 36/341  mediastinal]
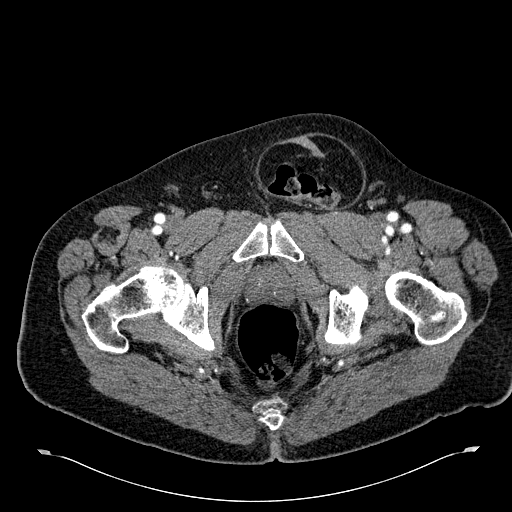
[im 36/341  bone]
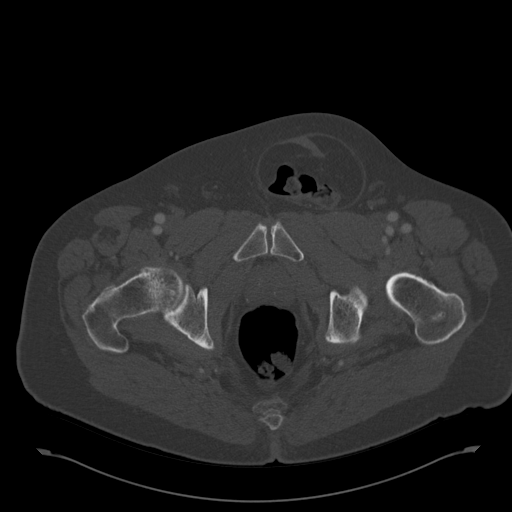
[im 72/341  mediastinal]
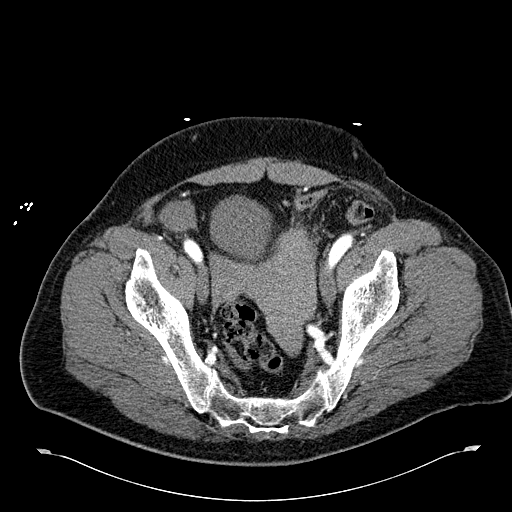
[im 108/341  mediastinal]
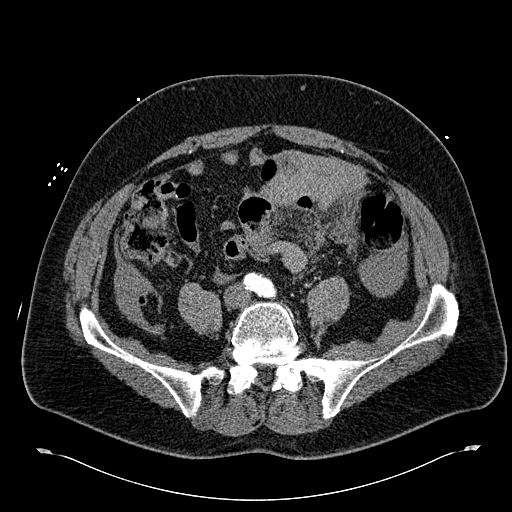
[im 144/341  mediastinal]
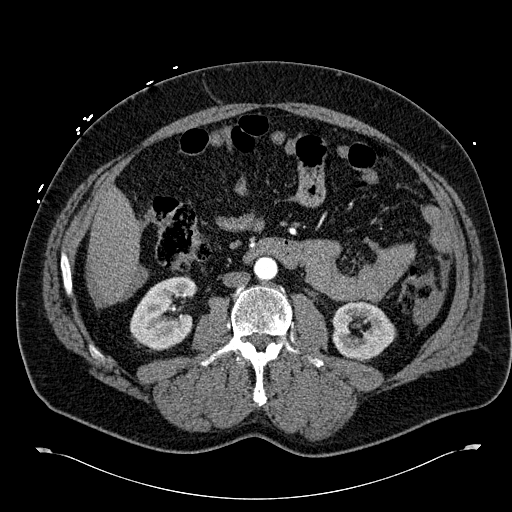
[im 179/341  mediastinal]
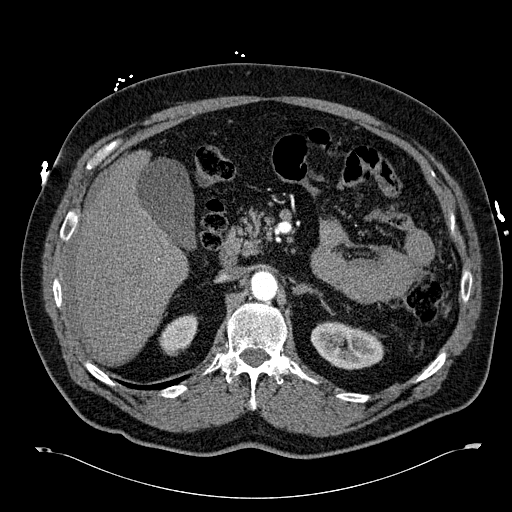
[im 215/341  mediastinal]
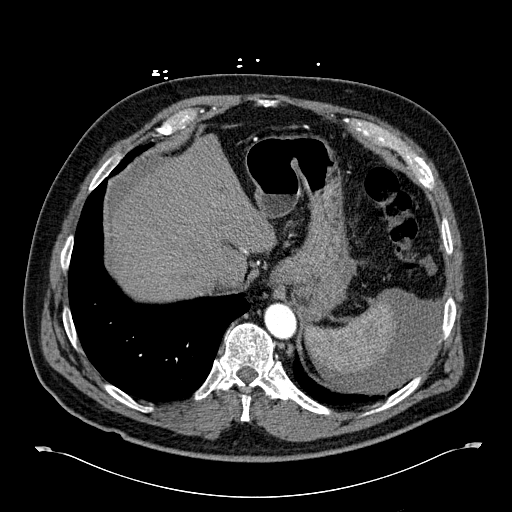
[im 251/341  mediastinal]
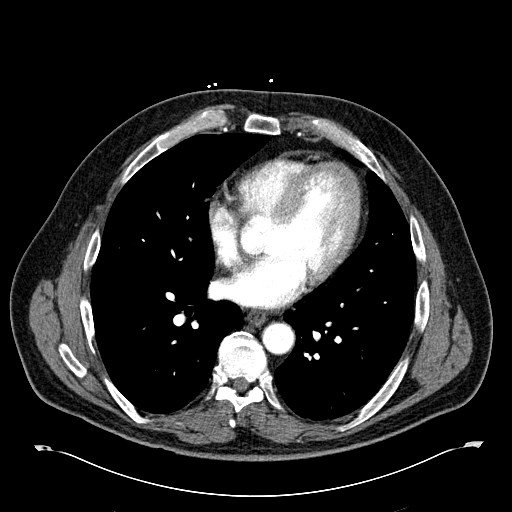
[im 269/341  lung]
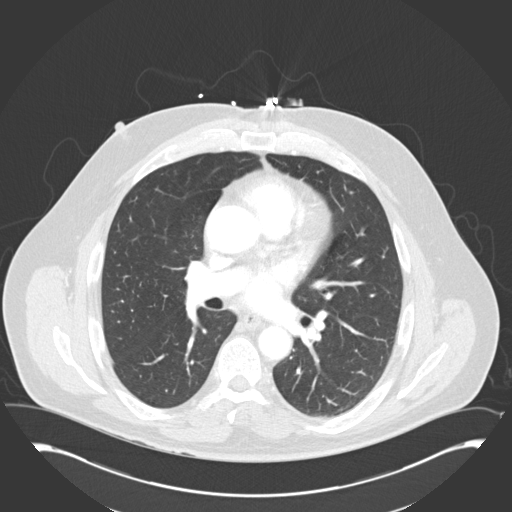
[im 287/341  mediastinal]
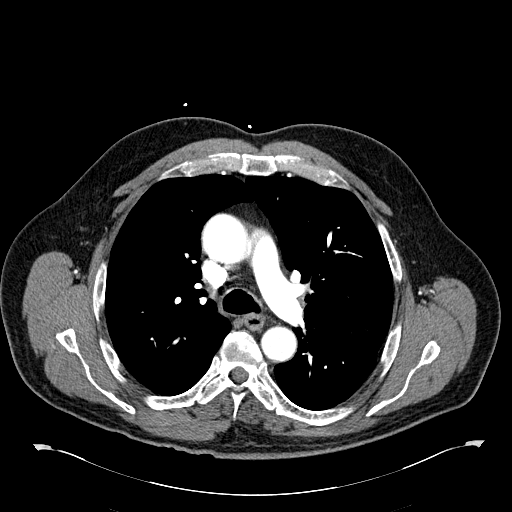
[im 287/341  lung]
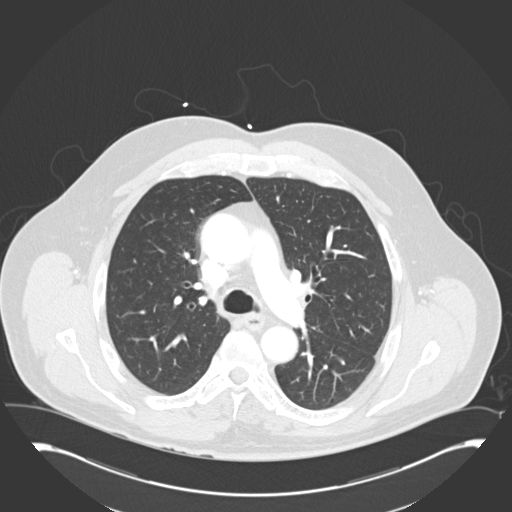
[im 305/341  lung]
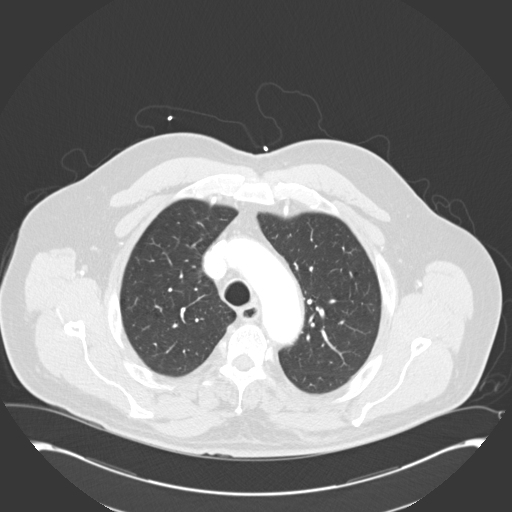
[im 323/341  mediastinal]
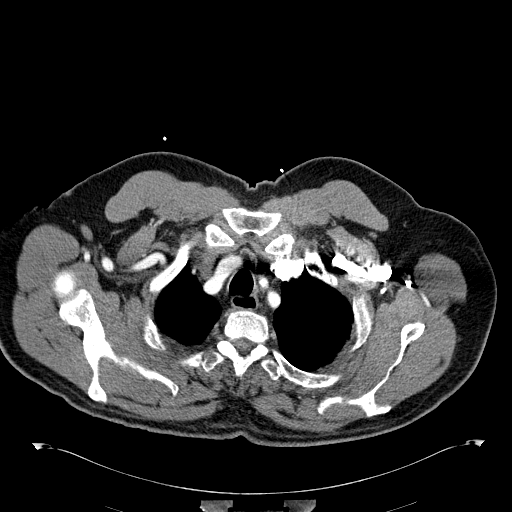
[im 323/341  lung]
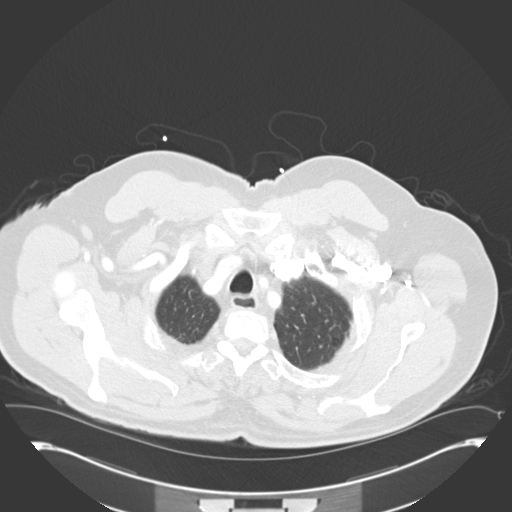
[im 323/341  bone]
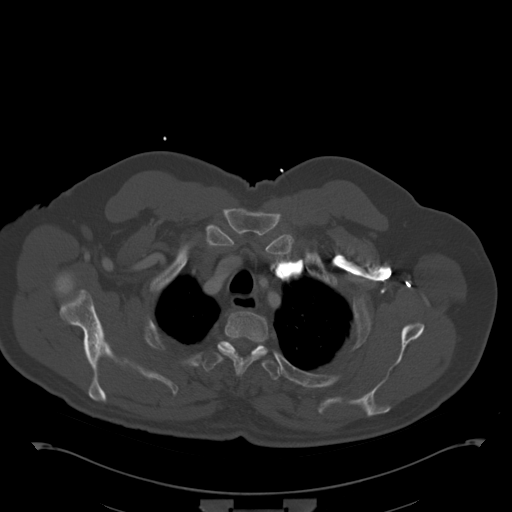

[Series 10: cor arterial mpr · coronal · arterial · 1.20mm/px · 1 of 158 slices shown, 2 images]
[im 79/158  mediastinal]
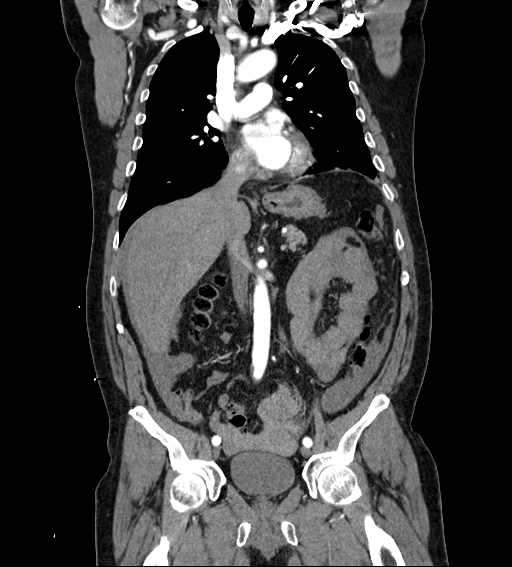
[im 79/158  bone]
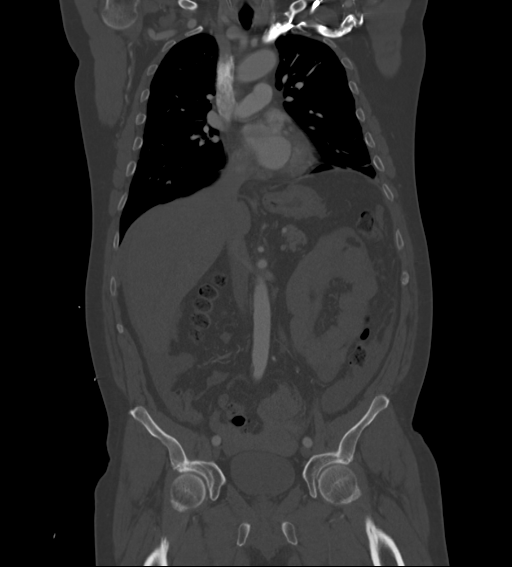

[12 of 36 positions shown; findings below may reference images not displayed]

FINDINGS: CTA CHEST FINDINGS

Small filling defect right lower lobe pulmonary artery compatible
with pulmonary embolism. No other pulmonary emboli. No evidence of
right heart strain.

Negative for aortic dissection or aneurysm. Heart size is normal. No
pericardial effusion.

Right upper lobe calcified granuloma has a benign appearance.
Negative for pneumonia or effusion. No mass lesion or adenopathy in
the chest.

Review of the MIP images confirms the above findings.

CTA ABDOMEN AND PELVIS FINDINGS

Moderate ascites. Small liver with possible cirrhosis. No focal
liver lesion however this is arterial scanning. Small calcified
gallstone without gallbladder thickening. Spleen normal in size.
Normal pancreas.

4 x 6 mm stone in the left renal pelvis with mild fullness of the
renal pelvis. This is a potential cause of left flank pain. 2 mm
nonobstructing stone left upper pole. No right renal calculi.

Negative for aortic dissection or aneurysm in the abdomen. Mild
atherosclerotic disease. Iliac arteries are widely patent.

High attenuation fluid in the pelvis compatible with blood. There is
a moderate amount of edema in the mesentery around the sigmoid
colon. There are several small gas bubbles which appear
extraluminal. Sigmoid colon appears somewhat thickened .This most
likely is an area of sigmoid perforation and hemorrhage. This may be
due to diverticulitis or other causes. Large left inguinal hernia
containing sigmoid colon. The sigmoid colon is not obstructed or
incarcerated.

Review of the MIP images confirms the above findings.
IMPRESSION: 1. Small pulmonary embolism right lower lobe
2. Negative for aortic aneurysm or dissection.
3. 4 x 6 mm stone left renal pelvis which could be a cause of left
flank pain. Mild dilatation of the renal pelvis.
4. Moderate ascites. Small liver which could be due to us cirrhosis
however the liver is not significantly irregular. Small gallstone
5. High attenuation fluid in the pelvis compatible with hemo
peritoneum. Thickened sigmoid colon segment with edema in the
surrounding mesenteries and extraluminal gas bubbles content
compatible with perforation and hemorrhage. Diverticulitis most
likely. Perforated tumor also a consideration. Surgical consultation
recommended.
Critical Value/emergent results were called by telephone at the time
of interpretation on 07/13/2014 at [DATE] to Dr. NAKUL REINKE ,
who verbally acknowledged these results.

## 2015-11-08 ENCOUNTER — Encounter: Payer: Self-pay | Admitting: Emergency Medicine

## 2015-11-08 ENCOUNTER — Emergency Department: Payer: BLUE CROSS/BLUE SHIELD

## 2015-11-08 ENCOUNTER — Emergency Department
Admission: EM | Admit: 2015-11-08 | Discharge: 2015-11-08 | Disposition: A | Discharge status: Home / Self Care | Payer: BLUE CROSS/BLUE SHIELD | Attending: Emergency Medicine | Admitting: Emergency Medicine

## 2015-11-08 DIAGNOSIS — W231XXA Caught, crushed, jammed, or pinched between stationary objects, initial encounter: Secondary | ICD-10-CM | POA: Diagnosis not present

## 2015-11-08 DIAGNOSIS — F17210 Nicotine dependence, cigarettes, uncomplicated: Secondary | ICD-10-CM | POA: Diagnosis not present

## 2015-11-08 DIAGNOSIS — Z7982 Long term (current) use of aspirin: Secondary | ICD-10-CM | POA: Diagnosis not present

## 2015-11-08 DIAGNOSIS — Y9289 Other specified places as the place of occurrence of the external cause: Secondary | ICD-10-CM | POA: Insufficient documentation

## 2015-11-08 DIAGNOSIS — S52501A Unspecified fracture of the lower end of right radius, initial encounter for closed fracture: Secondary | ICD-10-CM | POA: Insufficient documentation

## 2015-11-08 DIAGNOSIS — S52611A Displaced fracture of right ulna styloid process, initial encounter for closed fracture: Secondary | ICD-10-CM | POA: Diagnosis not present

## 2015-11-08 DIAGNOSIS — Y998 Other external cause status: Secondary | ICD-10-CM | POA: Insufficient documentation

## 2015-11-08 DIAGNOSIS — Y9389 Activity, other specified: Secondary | ICD-10-CM | POA: Insufficient documentation

## 2015-11-08 DIAGNOSIS — S6991XA Unspecified injury of right wrist, hand and finger(s), initial encounter: Secondary | ICD-10-CM | POA: Diagnosis present

## 2015-11-08 HISTORY — DX: Calculus of kidney: N20.0

## 2015-11-08 HISTORY — DX: Diverticulitis of intestine, part unspecified, without perforation or abscess without bleeding: K57.92

## 2015-11-08 HISTORY — DX: Calculus of gallbladder without cholecystitis without obstruction: K80.20

## 2015-11-08 HISTORY — DX: Acute embolism and thrombosis of unspecified deep veins of unspecified lower extremity: I82.409

## 2015-11-08 HISTORY — DX: Other pulmonary embolism without acute cor pulmonale: I26.99

## 2015-11-08 MED ORDER — OXYCODONE-ACETAMINOPHEN 5-325 MG PO TABS
2.0000 | ORAL_TABLET | Freq: Once | ORAL | Status: AC
  Administered 2015-11-08: 2 via ORAL

## 2015-11-08 MED ORDER — OXYCODONE-ACETAMINOPHEN 5-325 MG PO TABS: 1.0000 | ORAL_TABLET | Freq: Four times a day (QID) | ORAL | Status: AC | PRN

## 2015-11-08 MED ORDER — OXYCODONE-ACETAMINOPHEN 5-325 MG PO TABS
ORAL_TABLET | ORAL | Status: AC
  Administered 2015-11-08: 2 via ORAL
  Filled 2015-11-08: qty 2

## 2015-11-08 NOTE — ED Notes (Signed)
Patient ambulatory to triage with steady gait, without difficulty or distress noted; pt reports PTA fell getting OOB injuring right wrist; ace wrap in place

## 2015-11-08 NOTE — ED Notes (Signed)
Pt went to X-Ray.  

## 2015-11-08 NOTE — Discharge Instructions (Signed)
Forearm Fracture °A forearm fracture is a break in one or both of the bones of your arm that are between the elbow and the wrist. Your forearm is made up of two bones: °· Radius. This is the bone on the inside of your arm near your thumb. °· Ulna. This is the bone on the outside of your arm near your little finger. °Middle forearm fractures usually break both the radius and the ulna. Most forearm fractures that involve both the ulna and radius will require surgery. °CAUSES °Common causes of this type of fracture include: °· Falling on an outstretched arm. °· Accidents, such as a car or bike accident. °· A hard, direct hit to the middle part of your arm. °RISK FACTORS °You may be at higher risk for this type of fracture if: °· You play contact sports. °· You have a condition that causes your bones to be weak or thin (osteoporosis). °SIGNS AND SYMPTOMS °A forearm fracture causes pain immediately after the injury. Other signs and symptoms include: °· An abnormal bend or bump in your arm (deformity). °· Swelling. °· Numbness or tingling. °· Tenderness. °· Inability to turn your hand from side to side (rotate). °· Bruising. °DIAGNOSIS °Your health care provider may diagnose a forearm fracture based on: °· Your symptoms. °· Your medical history, including any recent injury. °· A physical exam. Your health care provider will look for any deformity and feel for tenderness over the break. Your health care provider will also check whether the bones are out of place. °· An X-ray exam to confirm the diagnosis and learn more about the type of fracture. °TREATMENT °The goals of treatment are to get the bone or bones in proper position for healing and to keep the bones from moving so they will heal over time. Your treatment will depend on many factors, especially the type of fracture that you have. °· If the fractured bone or bones: °¨ Are in the correct position (nondisplaced), you may only need to wear a cast or a  splint. °¨ Have a slightly displaced fracture, you may need to have the bones moved back into place manually (closed reduction) before the splint or cast is put on. °· You may have a temporary splint before you have a cast. The splint allows room for some swelling. After a few days, a cast can replace the splint. °· You may have to wear the cast for 6-8 weeks or as directed by your health care provider. °· The cast may be changed after about 3 weeks or as directed by your health care provider. °· After your cast is removed, you may need physical therapy to regain full movement in your wrist or elbow. °· You may need emergency surgery if you have: °¨ A fractured bone or bones that are out of position (displaced). °¨ A fracture with multiple fragments (comminuted fracture). °¨ A fracture that breaks the skin (open fracture). This type of fracture may require surgical wires, plates, or screws to hold the bone or bones in place. °· You may have X-rays every couple of weeks to check on your healing. °HOME CARE INSTRUCTIONS °If You Have a Cast: °· Do not stick anything inside the cast to scratch your skin. Doing that increases your risk of infection. °· Check the skin around the cast every day. Report any concerns to your health care provider. You may put lotion on dry skin around the edges of the cast. Do not apply lotion to the skin   underneath the cast. °If You Have a Splint: °· Wear it as directed by your health care provider. Remove it only as directed by your health care provider. °· Loosen the splint if your fingers become numb and tingle, or if they turn cold and blue. °Bathing °· Cover the cast or splint with a watertight plastic bag to protect it from water while you bathe or shower. Do not let the cast or splint get wet. °Managing Pain, Stiffness, and Swelling °· If directed, apply ice to the injured area: °¨ Put ice in a plastic bag. °¨ Place a towel between your skin and the bag. °¨ Leave the ice on for 20  minutes, 2-3 times a day. °· Move your fingers often to avoid stiffness and to lessen swelling. °· Raise the injured area above the level of your heart while you are sitting or lying down. °Driving °· Do not drive or operate heavy machinery while taking pain medicine. °· Do not drive while wearing a cast or splint on a hand that you use for driving. °Activity °· Return to your normal activities as directed by your health care provider. Ask your health care provider what activities are safe for you. °· Perform range-of-motion exercises only as directed by your health care provider. °Safety °· Do not use your injured limb to support your body weight until your health care provider says that you can. °General Instructions °· Do not put pressure on any part of the cast or splint until it is fully hardened. This may take several hours. °· Keep the cast or splint clean and dry. °· Do not use any tobacco products, including cigarettes, chewing tobacco, or electronic cigarettes. Tobacco can delay bone healing. If you need help quitting, ask your health care provider. °· Take medicines only as directed by your health care provider. °· Keep all follow-up visits as directed by your health care provider. This is important. °SEEK MEDICAL CARE IF: °· Your pain medicine is not helping. °· Your cast or splint becomes wet or damaged or suddenly feels too tight. °· Your cast becomes loose. °· You have more severe pain or swelling than you did before the cast. °· You have severe pain when you stretch your fingers. °· You continue to have pain or stiffness in your elbow or your wrist after your cast is removed. °SEEK IMMEDIATE MEDICAL CARE IF: °· You cannot move your fingers. °· You lose feeling in your fingers or your hand. °· Your hand or your fingers turn cold and pale or blue. °· You notice a bad smell coming from your cast. °· You have drainage from underneath your cast. °· You have new stains from blood or drainage that is coming  through your cast. °  °This information is not intended to replace advice given to you by your health care provider. Make sure you discuss any questions you have with your health care provider. °  °Document Released: 11/23/2000 Document Revised: 12/17/2014 Document Reviewed: 07/12/2014 °Elsevier Interactive Patient Education ©2016 [employer] ° °Radial Fracture °A radial fracture is a break in the radius bone, which is the long bone of the forearm that is on the same side as your thumb. Your forearm is the part of your arm that is between your elbow and your wrist. It is made up of two bones: the radius and the ulna. °Most radial fractures occur near the wrist (distal radial fracture) or near the elbow (radial head fracture). A distal radial fracture is the most   common type of broken arm. This fracture usually occurs about an inch above the wrist. Fractures of the middle part of the bone are less common. CAUSES  Falling with your arm outstretched is the most common cause of a radial fracture. Other causes include:  Car accidents.  Bike accidents.  A direct blow to the middle part of the radius. RISK FACTORS  You may be at greater risk for a distal radial fracture if you are 28 years of age or older.  You may be at greater risk for a radial head fracture if you are:  Male.  2-92 years old.  You may be at a greater risk for all types of radial fractures if you have a condition that causes your bones to be weak or thin (osteoporosis). SIGNS AND SYMPTOMS A radial fracture causes pain immediately after the injury. Other signs and symptoms include:  An abnormal bend or bump in your arm (deformity).  Swelling.  Bruising.  Numbness or tingling.  Tenderness.  Limited movement. DIAGNOSIS  Your health care provider may diagnose a radial fracture based on:  Your symptoms.  Your medical history, including any recent injury.  A physical exam. Your health care provider will look for  any deformity and feel for tenderness over the break. Your health care provider will also check whether the bone is out of place.  An X-ray exam to confirm the diagnosis and learn more about the type of fracture. TREATMENT The goals of treatment are to get the bone in proper position for healing and to keep it from moving so it will heal over time. Your treatment will depend on many factors, especially the type of fracture that you have.  If the fractured bone:  Is in the correct position (nondisplaced), you may only need to wear a cast or a splint.  Has a slightly displaced fracture, you may need to have the bones moved back into place manually (closed reduction) before the splint or cast is put on.  You may have a temporary splint before you have a plaster cast. The splint allows room for some swelling. After a few days, a cast can replace the splint.  You may have to wear the cast for about 6 weeks or as directed by your health care provider.  The cast may be changed after about 3 weeks or as directed by your health care provider.  After your cast is taken off, you may need physical therapy to regain full movement in your wrist or elbow.  You may need emergency surgery if you have:  A fractured bone that is out of position (displaced).  A fracture with multiple fragments (comminuted fracture).  A fracture that breaks the skin (open fracture). This type of fracture may require surgical wires, plates, or screws to hold the bone in place.  You may have X-rays every couple of weeks to check on your healing. HOME CARE INSTRUCTIONS  Keep the injured arm above the level of your heart while you are sitting or lying down. This helps to reduce swelling and pain.  Apply ice to the injured area:  Put ice in a plastic bag.  Place a towel between your skin and the bag.  Leave the ice on for 20 minutes, 2-3 times per day.  Move your fingers often to avoid stiffness and to minimize  swelling.  If you have a plaster or fiberglass cast:  Do not try to scratch the skin under the cast using sharp or pointed  objects. °¨ Check the skin around the cast every day. You may put lotion on any red or sore areas. °¨ Keep your cast dry and clean. °· If you have a plaster splint: °¨ Wear the splint as directed. °¨ Loosen the elastic around the splint if your fingers become numb and tingle, or if they turn cold and blue. °· Do not put pressure on any part of your cast until it is fully hardened. Rest your cast only on a pillow for the first 24 hours. °· Protect your cast or splint while bathing or showering, as directed by your health care provider. Do not put your cast or splint into water. °· Take medicines only as directed by your health care provider. °· Return to activities, such as sports, as directed by your health care provider. Ask your health care provider what activities are safe for you. °· Keep all follow-up visits as directed by your health care provider. This is important. °SEEK MEDICAL CARE IF: °· Your pain medicine is not helping. °· Your cast gets damaged or it breaks. °· Your cast becomes loose. °· Your cast gets wet. °· You have more severe pain or swelling than you did before the cast. °· You have severe pain when stretching your fingers. °· You continue to have pain or stiffness in your elbow or your wrist after your cast is taken off. °SEEK IMMEDIATE MEDICAL CARE IF: °· You cannot move your fingers. °· You lose feeling in your fingers or your hand. °· Your hand or your fingers turn cold and pale or blue. °· You notice a bad smell coming from your cast. °· You have drainage from underneath your cast. °· You have new stains from blood or drainage seeping through your cast. °  °This information is not intended to replace advice given to you by your health care provider. Make sure you discuss any questions you have with your health care provider. °  °Document Released: 05/09/2006  Document Revised: 12/17/2014 Document Reviewed: 05/21/2014 °Elsevier Interactive Patient Education ©2016 [employer] ° °Ulnar Fracture °An ulnar fracture is a break in the ulna bone, which is the forearm bone that is located on the same side as your little finger. Your forearm is the part of your arm that is between your elbow and your wrist. It is made up of two bones: the radius and ulna. The ulna forms the point of your elbow at its upper end. The lower end can be felt on the outside of your wrist. °An ulnar fracture can happen near the wrist or elbow or in the middle of your forearm. Middle forearm fractures usually break both the radius and the ulna. °CAUSES °A heavy, direct blow to the forearm is the most common cause of an ulnar fracture. It takes a lot of force to break a bone in your forearm. This type of injury may be caused by: °· An accident, such as a car or bike accident. °· Falling with your arm outstretched. °RISK FACTORS °You may be at greater risk for an ulnar fracture if you: °· Play contact sports. °· Have a condition that causes your bones to be weak or thin (osteoporosis). °SIGNS AND SYMPTOMS  °An ulnar fracture causes pain immediately after the injury. You may need to support your forearm with your other hand. Other signs and symptoms include: °· An abnormal bend or bump in your arm (deformity). °· Swelling. °· Bruising. °· Numbness or weakness in your hand. °· Inability to turn   your hand from side to side (rotate). °DIAGNOSIS °Your health care provider may diagnose an ulnar fracture based on: °· Your symptoms. °· Your medical history, including any recent injury. °· A physical exam. Your health care provider will look for any deformity and feel for tenderness over the break. Your health care provider will also check whether the bone is out of place. °· An X-ray exam to confirm the diagnosis and learn more about the type of fracture. °TREATMENT °The goals of treatment are to get the bone in  proper position for healing and to keep it from moving so it will heal over time. Your treatment will depend on many factors, especially the type of fracture that you have. °· If the fractured bone: °¨ Is in the correct position (nondisplaced), you may only need to wear a cast or a splint. °¨ Has a slightly displaced fracture, you may need to have the bones moved back into place manually (closed reduction) before the splint or cast is put on. °· You may have a temporary splint before you have a plaster cast. The splint allows room for some swelling. After a few days, a cast can replace the splint. °¨ You may have to wear the cast for about 6 weeks or as directed by your health care provider. °¨ The cast may be changed after about 3 weeks or as directed by your health care provider. °· After your cast is taken off, you may need physical therapy to regain full movement in your wrist or elbow. °· You may need emergency surgery if you have: °¨ A fractured bone that is out of position (displaced). °¨ A fracture with multiple fragments (comminuted fracture). °¨ A fracture that breaks the skin (open fracture). This type of fracture may require surgical wires, plates, or screws to hold the bone in place. °· You may have X-rays every couple of weeks to check on your healing. °HOME CARE INSTRUCTIONS °· Keep the injured arm above the level of your heart while you are sitting or lying down. This helps to reduce swelling and pain. °· Apply ice to the injured area: °¨ Put ice in a plastic bag. °¨ Place a towel between your skin and the bag. °¨ Leave the ice on for 20 minutes, 2-3 times per day. °· Move your fingers often to avoid stiffness and to minimize swelling. °· If you have a plaster or fiberglass cast: °¨ Do not try to scratch the skin under the cast using sharp or pointed objects. °¨ Check the skin around the cast every day. You may put lotion on any red or sore areas. °¨ Keep your cast dry and clean. °· If you have a  plaster splint: °¨ Wear the splint as directed. °¨ Loosen the elastic around the splint if your fingers become numb and tingle, or if they turn cold and blue. °· Do not put pressure on any part of your cast until it is fully hardened. Rest your cast only on a pillow for the first 24 hours. °· Protect your cast or splint while bathing or showering, as directed by your health care provider. Do not put your cast or splint into water. °· Take medicines only as directed by your health care provider. °· Return to activities, such as sports, as directed by your health care provider. Ask your health care provider what activities are safe for you. °· Keep all follow-up visits as directed by your health care provider. This is important. °SEEK MEDICAL CARE   IF: °· Your pain medicine is not helping. °· Your cast gets damaged or it breaks. °· Your cast becomes loose. °· Your cast gets wet. °· You have more severe pain or swelling than you did before the cast. °· You have severe pain when stretching your fingers. °· You continue to have pain or stiffness in your elbow or your wrist after your cast is taken off. °SEEK IMMEDIATE MEDICAL CARE IF: °· You cannot move your fingers. °· You lose feeling in your fingers or your hand. °· Your hand or your fingers turn cold and pale or blue. °· You notice a bad smell coming from your cast. °· You have drainage from underneath your cast. °· You have new stains from blood or drainage seeping through your cast. °  °This information is not intended to replace advice given to you by your health care provider. Make sure you discuss any questions you have with your health care provider. °  °Document Released: 05/09/2006 Document Revised: 12/17/2014 Document Reviewed: 05/05/2014 °Elsevier Interactive Patient Education ©2016 [employer]. ° °

## 2015-11-08 NOTE — ED Provider Notes (Signed)
[facility] Emergency Department Provider Note  ____________________________________________  Time seen: Approximately 3:02 AM  I have reviewed the triage vital signs and the nursing notes.   HISTORY  Chief Complaint Wrist Pain    HPI Francisco Barnes is a 64 y.o. male who comes in tonight after a fall. The patient reports that he tried to catch himself by putting his arms out and caught his right wrist. The patient developed immediate pain on his wrist so came in for evaluation. The patient's able to move his fingers but reports that his wrist is swollen. The patient has broken his finger on the right side in the past but never any other problems. The patient denies any other pain, he denies hitting his head or having neck or back pain. The patient saw his primary care physician yesterday and everything was fine.When the patient arrived he was having some pain 5 out of 10 in intensity which intensified after x-ray.   Past Medical History  Diagnosis Date  . Diverticulitis   . Kidney stones   . Gallstones   . PE (pulmonary embolism)   . DVT (deep venous thrombosis) (HCC)     There are no active problems to display for this patient.   Past Surgical History  Procedure Laterality Date  . Foot surgery    . Ivc filter placement ([facility] hx)      Current Outpatient Rx  Name  Route  Sig  Dispense  Refill  . aspirin 325 MG tablet   Oral   Take 325 mg by mouth daily.           Allergies Review of patient's allergies indicates no known allergies.  No family history on file.  Social History Social History  Substance Use Topics  . Smoking status: Current Every Day Smoker -- 2.00 packs/day    Types: Cigarettes  . Smokeless tobacco: None  . Alcohol Use: No    Review of Systems Constitutional: No fever/chills Eyes: No visual changes. ENT: No sore throat. Cardiovascular: Denies chest pain. Respiratory: Denies shortness of breath. Gastrointestinal: No  abdominal pain.  No nausea, no vomiting.  No diarrhea.  No constipation. Genitourinary: Negative for dysuria. Musculoskeletal: Right wrist pain Skin: Negative for rash. Neurological: Negative for headaches, focal weakness or numbness.  10-point ROS otherwise negative.  ____________________________________________   PHYSICAL EXAM:  VITAL SIGNS: ED Triage Vitals  Enc Vitals Group     BP 11/08/15 0113 143/85 mmHg     Pulse Rate 11/08/15 0113 71     Resp 11/08/15 0113 16     Temp 11/08/15 0113 98.2 F (36.8 C)     Temp Source 11/08/15 0113 Oral     SpO2 11/08/15 0113 97 %     Weight 11/08/15 0113 230 lb (104.327 kg)     Height 11/08/15 0113 6' (1.829 m)     Head Cir --      Peak Flow --      Pain Score 11/08/15 0117 5     Pain Loc --      Pain Edu? --      Excl. in GC? --     Constitutional: Alert and oriented. Well appearing and in mild distress. Eyes: Conjunctivae are normal. PERRL. EOMI. Head: Atraumatic. Nose: No congestion/rhinnorhea. Mouth/Throat: Mucous membranes are moist.  Oropharynx non-erythematous. Neck: No cervical spine tenderness to palpation. Cardiovascular: Normal rate, regular rhythm. Grossly normal heart sounds.  Good peripheral circulation. Respiratory: Normal respiratory effort.  No retractions. Lungs CTAB.  Gastrointestinal: Soft and nontender. No distention. Positive bowel sounds Musculoskeletal: Right wrist with some tenderness to palpation and swelling, patient able to move fingers and has good sensation. Pulses intact to bilateral radial arteries.   Neurologic:  Normal speech and language.  Skin:  Skin is warm, dry and intact.  Psychiatric: Mood and affect are normal.   ____________________________________________   LABS (all labs ordered are listed, but only abnormal results are displayed)  Labs Reviewed - No data to  display ____________________________________________  EKG  None ____________________________________________  RADIOLOGY  Right wrist x-ray: Fractures of the distal radius and ulna styloid. ____________________________________________   PROCEDURES  Procedure(s) performed: Please, see procedure note(s).   SPLINT APPLICATION Date/Time: 4:30 AM Authorized by: Lucrezia Europe P Consent: Verbal consent obtained. Risks and benefits: risks, benefits and alternatives were discussed Consent given by: patient Splint applied by: ED technician Location details: right wrist Splint type: clamshell Supplies used: orthoglass Post-procedure: The splinted body part was neurovascularly unchanged following the procedure. Patient tolerance: Patient tolerated the procedure well with no immediate complications.   Critical Care performed: No  ____________________________________________   INITIAL IMPRESSION / ASSESSMENT AND PLAN / ED COURSE  Pertinent labs & imaging results that were available during my care of the patient were reviewed by me and considered in my medical decision making (see chart for details).  This is a 64 year old male who tripped and fell tonight and put his arms out to catch himself. The patient has distal radius and ulna styloid fracture. We did place a splint on the patient and place him in a sling. The patient also received 2 Percocet. I will discharge the patient to follow-up with orthopedic surgery. The patient's family have no further questions and will be discharged home. ____________________________________________   FINAL CLINICAL IMPRESSION(S) / ED DIAGNOSES  Final diagnoses:  Distal radius fracture, right, closed, initial encounter  Fracture of ulnar styloid, right, closed, initial encounter      Rebecka Apley, MD 11/08/15 0430

## 2016-05-10 ENCOUNTER — Ambulatory Visit: Payer: BLUE CROSS/BLUE SHIELD | Admitting: Cardiology

## 2016-10-05 IMAGING — CR DG WRIST COMPLETE 3+V*R*
1 series · 4 of 4 positions shown · non-contrast
Comparison: None.

CLINICAL DATA: Fell at [DATE].

EXAM:
RIGHT WRIST - COMPLETE 3+ VIEW

[Series 1: x wrist pa right · 0.14mm/px · 4 of 4 slices shown]
[im 1/4]
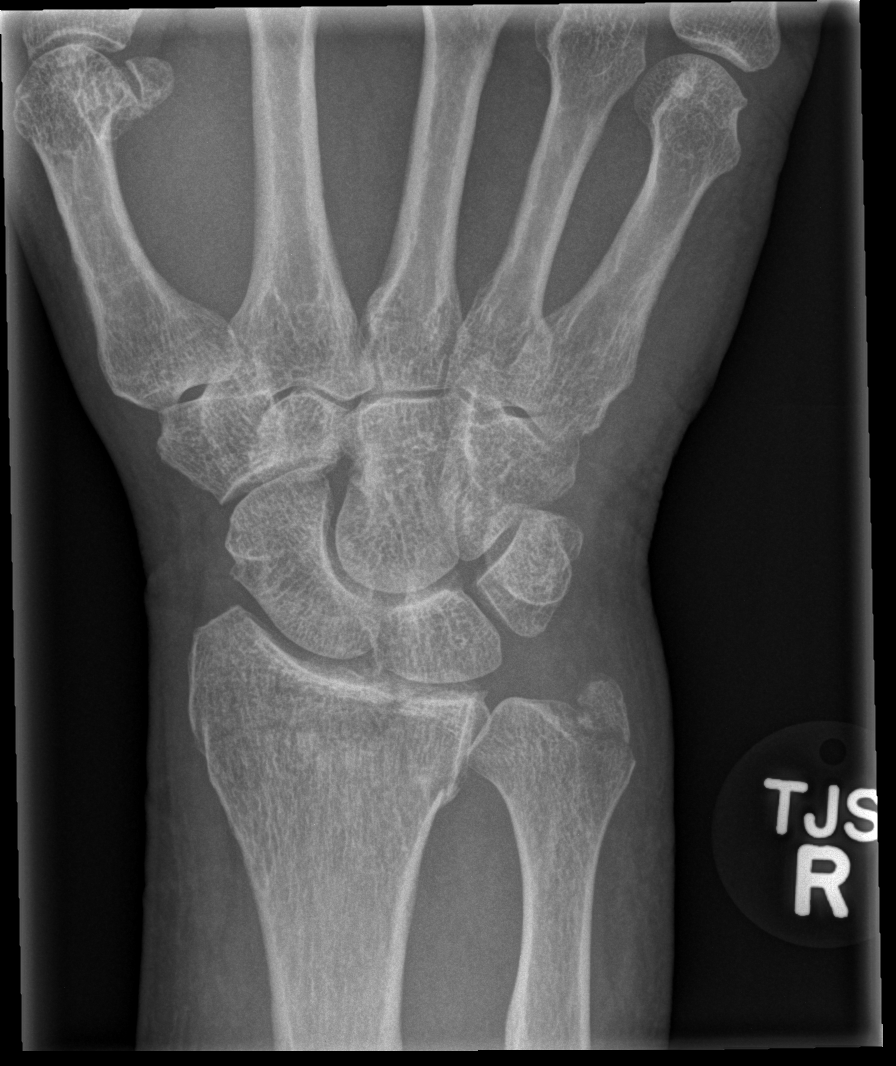
[im 2/4]
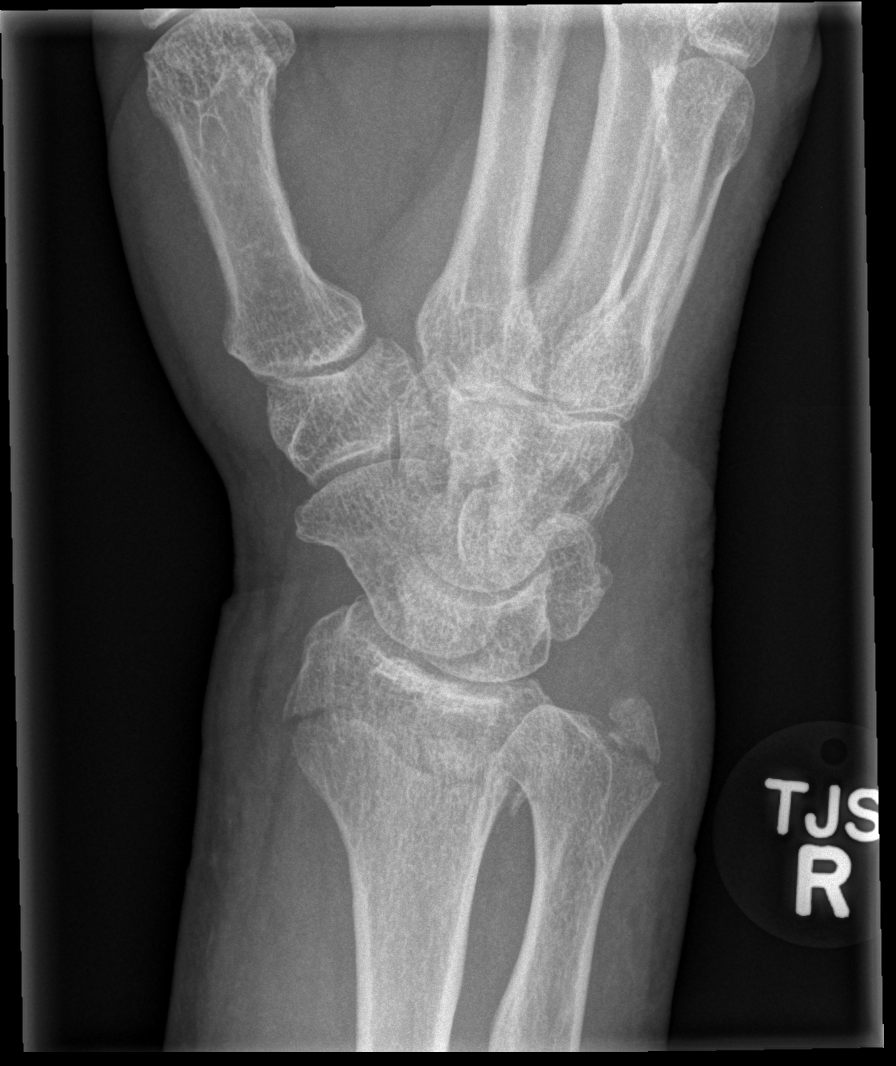
[im 3/4]
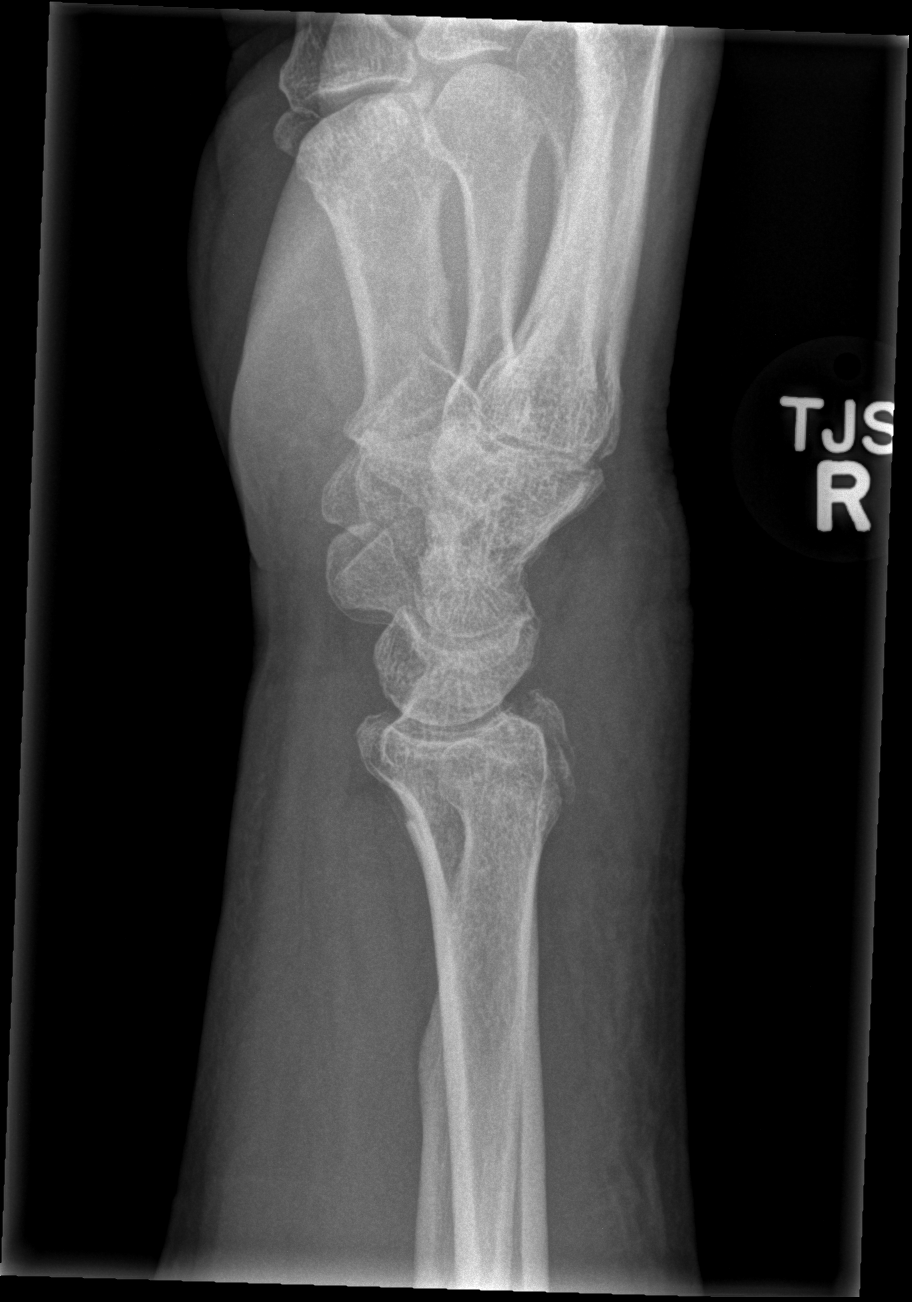
[im 4/4]
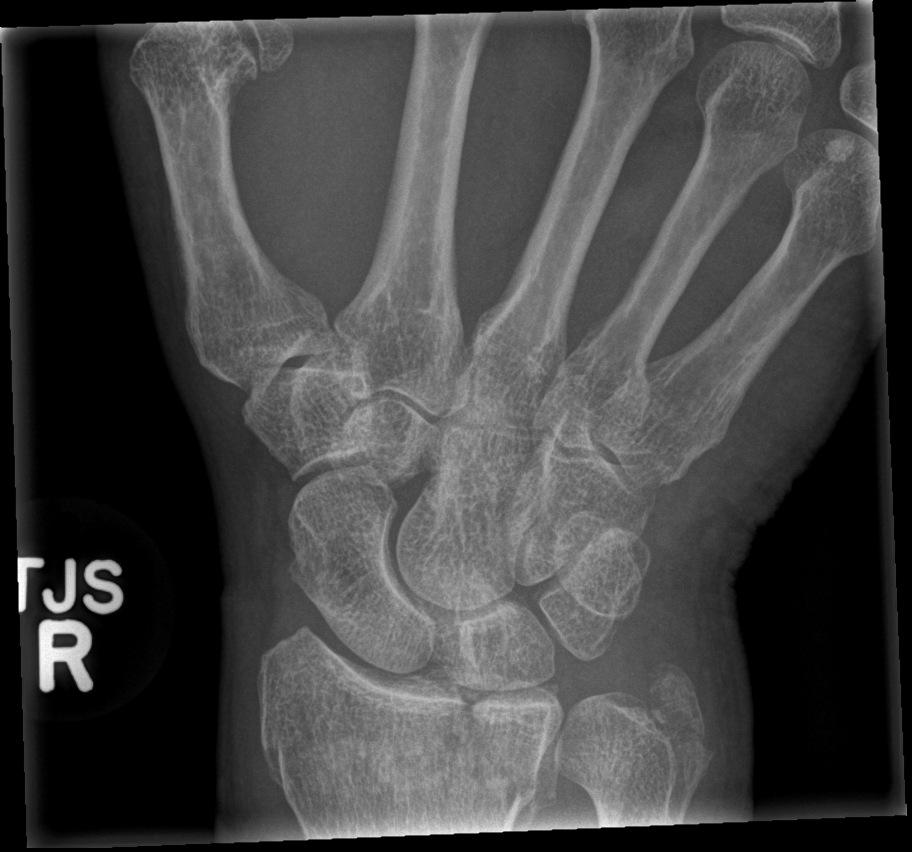

[4 of 4 positions shown; findings below may reference images not displayed]

FINDINGS: There is a comminuted intra-articular fracture of the distal radius
with good alignment of the major fracture fragments. There is a
minimally displaced well aligned transverse fracture across the base
of the ulnar styloid. No radiopaque foreign body. No dislocation.
IMPRESSION: Fractures of the distal radius and ulnar styloid.

## 2019-02-24 ENCOUNTER — Other Ambulatory Visit: Payer: Self-pay

## 2019-02-24 ENCOUNTER — Ambulatory Visit (INDEPENDENT_AMBULATORY_CARE_PROVIDER_SITE_OTHER): Payer: BLUE CROSS/BLUE SHIELD | Admitting: Vascular Surgery

## 2019-02-24 ENCOUNTER — Encounter (INDEPENDENT_AMBULATORY_CARE_PROVIDER_SITE_OTHER): Payer: Self-pay | Admitting: Vascular Surgery

## 2019-02-24 VITALS — BP 152/84 | HR 67 | Resp 10 | Ht 72.0 in | Wt 231.0 lb

## 2019-02-24 DIAGNOSIS — Z86718 Personal history of other venous thrombosis and embolism: Secondary | ICD-10-CM | POA: Diagnosis not present

## 2019-02-24 DIAGNOSIS — F17200 Nicotine dependence, unspecified, uncomplicated: Secondary | ICD-10-CM

## 2019-02-24 DIAGNOSIS — I7300 Raynaud's syndrome without gangrene: Secondary | ICD-10-CM

## 2019-02-24 DIAGNOSIS — F17210 Nicotine dependence, cigarettes, uncomplicated: Secondary | ICD-10-CM | POA: Diagnosis not present

## 2019-02-24 DIAGNOSIS — Z7982 Long term (current) use of aspirin: Secondary | ICD-10-CM

## 2019-02-24 NOTE — Progress Notes (Signed)
Patient ID: Francisco Barnes, male   DOB: May 14, 1951, 68 y.o.   MRN: [id number]  Chief Complaint  Patient presents with   New Patient (Initial Visit)    HPI [name] is a 68 y.o. male.  I am asked to see the patient by [name], NP for evaluation of cyanosis of fingers on both hands.  He works at the [employer] and is often in cold environments and on several occasions has noticed several fingers turning white and very pale.  They then become somewhat purple and then red.  It has been his fourth finger on the right hand and his second and fifth fingers on the left hand most commonly.  This is somewhat painful.  He warms the hands and eventually it comes back.  No ulceration or infection.  No aggravating factors other than the cold stimulation.  Warming alleviates the symptoms.  It has not stopped him from doing normal activities or work.  I apparently saw him about 5 years ago for DVT and PE.  He had an IVC filter placed and retrieved back in 2015.   Past Medical History:  Diagnosis Date   Diverticulitis    DVT (deep venous thrombosis) (HCC)    Gallstones    Kidney stones    PE (pulmonary embolism)     Past Surgical History:  Procedure Laterality Date   FOOT SURGERY     IVC FILTER PLACEMENT ([facility] HX)      Family History No bleeding disorders, clotting disorders, autoimmune diseases, or aneurysms  Social History Social History   Tobacco Use   Smoking status: Current Every Day Smoker    Packs/day: 2.00    Types: Cigarettes   Smokeless tobacco: Never Used  Substance Use Topics   Alcohol use: No   Drug use: Never     No Known Allergies  Current Outpatient Medications  Medication Sig Dispense Refill   aspirin 325 MG tablet Take 325 mg by mouth daily.     lisinopril (PRINIVIL,ZESTRIL) 5 MG tablet Take 5 mg by mouth daily.     sildenafil (REVATIO) 20 MG tablet Take 1 tablet by mouth.     Cyanocobalamin (VITAMIN B-12) 5000 MCG TBDP Take 1 tablet by mouth  daily.     oxyCODONE-acetaminophen (PERCOCET) 5-325 MG tablet Take 1-2 tablets by mouth every 6 (six) hours as needed for severe pain. (Patient not taking: Reported on 02/24/2019) 12 tablet 0   No current facility-administered medications for this visit.       REVIEW OF SYSTEMS (Negative unless checked)  Constitutional: [] Weight loss  [] Fever  [] Chills Cardiac: [] Chest pain   [] Chest pressure   [] Palpitations   [] Shortness of breath when laying flat   [] Shortness of breath at rest   [] Shortness of breath with exertion. Vascular:  [] Pain in legs with walking   [] Pain in legs at rest   [] Pain in legs when laying flat   [] Claudication   [] Pain in feet when walking  [] Pain in feet at rest  [] Pain in feet when laying flat   [x] History of DVT   [] Phlebitis   [] Swelling in legs   [] Varicose veins   [] Non-healing ulcers Pulmonary:   [] Uses home oxygen   [] Productive cough   [] Hemoptysis   [] Wheeze  [] COPD   [] Asthma Neurologic:  [] Dizziness  [] Blackouts   [] Seizures   [] History of stroke   [] History of TIA  [] Aphasia   [] Temporary blindness   [] Dysphagia   [] Weakness or numbness in arms   []   Weakness or numbness in legs Musculoskeletal:  [x] Arthritis   [] Joint swelling   [x] Joint pain   [] Low back pain Hematologic:  [] Easy bruising  [] Easy bleeding   [] Hypercoagulable state   [] Anemic  [] Hepatitis Gastrointestinal:  [] Blood in stool   [] Vomiting blood  [] Gastroesophageal reflux/heartburn   [] Abdominal pain Genitourinary:  [] Chronic kidney disease   [] Difficult urination  [] Frequent urination  [] Burning with urination   [] Hematuria Skin:  [] Rashes   [] Ulcers   [] Wounds Psychological:  [] History of anxiety   []  History of major depression.    Physical Exam BP (!) 152/84 (BP Location: Left Arm, Patient Position: Sitting, Cuff Size: Small)    Pulse 67    Resp 10    Ht 6' (1.829 m)    Wt 231 lb (104.8 kg)    BMI 31.33 kg/m  Gen:  WD/WN, NAD Head: Xenia/AT, No temporalis wasting.  Ear/Nose/Throat: Hearing  grossly intact, nares w/o erythema or drainage, oropharynx w/o Erythema/Exudate Eyes: Conjunctiva clear, sclera non-icteric  Neck: trachea midline.  No JVD.  Pulmonary:  Good air movement, respirations not labored, no use of accessory muscles  Cardiac: RRR, no JVD Vascular:  Vessel Right Left  Radial Palpable Palpable                                   Gastrointestinal:. No masses, surgical incisions, or scars. Musculoskeletal: M/S 5/5 throughout.  Extremities without ischemic changes.  No deformity or atrophy. No significant LE edema. Neurologic: Sensation grossly intact in extremities.  Symmetrical.  Speech is fluent. Motor exam as listed above. Psychiatric: Judgment intact, Mood & affect appropriate for pt's clinical situation. Dermatologic: No rashes or ulcers noted.  No cellulitis or open wounds.    Radiology No results found.  Labs No results found for this or any previous visit (from the past 2160 hour(s)).  Assessment/Plan:  History of DVT (deep vein thrombosis) Had a filter placed and removed.  Completed a course of anticoagulation.  No recurrent symptoms.  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  At current, he says it is not bothersome enough to consider further evaluation or intervention.  He should continue his aspirin therapy.  We have discussed if his symptoms worsen, the addition of a calcium channel blocker.  We discussed the importance of avoidance of cold stimulation and wearing gloves in cold environments.  If his symptoms persist or worsen, upper extremity arterial duplex can be performed to ensure that there is not significant arterial disease particular the patient who smokes.      [name] 02/25/2019, 10:13 AM   This note was created with Dragon medical transcription system.  Any errors from dictation are unintentional.

## 2019-02-25 DIAGNOSIS — I7300 Raynaud's syndrome without gangrene: Secondary | ICD-10-CM | POA: Insufficient documentation

## 2019-02-25 DIAGNOSIS — F17200 Nicotine dependence, unspecified, uncomplicated: Secondary | ICD-10-CM | POA: Insufficient documentation

## 2019-02-25 DIAGNOSIS — Z86718 Personal history of other venous thrombosis and embolism: Secondary | ICD-10-CM | POA: Insufficient documentation

## 2019-02-25 NOTE — Assessment & Plan Note (Signed)
Had a filter placed and removed.  Completed a course of anticoagulation.  No recurrent symptoms.

## 2019-02-25 NOTE — Assessment & Plan Note (Signed)
The patient describes typical symptoms of Raynaud's disease.  At current, he says it is not bothersome enough to consider further evaluation or intervention.  He should continue his aspirin therapy.  We have discussed if his symptoms worsen, the addition of a calcium channel blocker.  We discussed the importance of avoidance of cold stimulation and wearing gloves in cold environments.  If his symptoms persist or worsen, upper extremity arterial duplex can be performed to ensure that there is not significant arterial disease particular the patient who smokes.

## 2019-02-25 NOTE — Patient Instructions (Signed)
Raynaud Phenomenon    Raynaud phenomenon is a condition that affects the blood vessels (arteries) that carry blood to your fingers and toes. The arteries that supply blood to your ears, lips, nipples, or the tip of your nose might also be affected. Raynaud phenomenon causes the arteries to become narrow temporarily (spasm). As a result, the flow of blood to the affected areas is temporarily decreased. This usually occurs in response to cold temperatures or stress. During an attack, the skin in the affected areas turns white, then blue, and finally red. You may also feel tingling or numbness in those areas.  Attacks usually last for only a brief period, and then the blood flow to the area returns to normal. In most cases, Raynaud phenomenon does not cause serious health problems.  What are the causes?  In many cases, the cause of this condition is not known. The condition may occur on its own (primary Raynaud phenomenon) or may be associated with other diseases or factors (secondary Raynaud phenomenon).  Possible causes may include:  · Diseases or medical conditions that damage the arteries.  · Injuries and repetitive actions that hurt the hands or feet.  · Being exposed to certain chemicals.  · Taking medicines that narrow the arteries.  · Other medical conditions, such as lupus, scleroderma, rheumatoid arthritis, thyroid problems, blood disorders, Sjogren syndrome, or atherosclerosis.  What increases the risk?  The following factors may make you more likely to develop this condition:  · Being 20-40 years old.  · Being male.  · Having a family history of Raynaud phenomenon.  · Living in a cold climate.  · Smoking.  What are the signs or symptoms?  Symptoms of this condition usually occur when you are exposed to cold temperatures or when you have emotional stress. The symptoms may last for a few minutes or up to several hours. They usually affect your fingers but may also affect your toes, nipples, lips, ears, or  the tip of your nose. Symptoms may include:  · Changes in skin color. The skin in the affected areas will turn pale or white. The skin may then change from white to bluish to red as normal blood flow returns to the area.  · Numbness, tingling, or pain in the affected areas.  In severe cases, symptoms may include:  · Skin sores.  · Tissues decaying and dying (gangrene).  How is this diagnosed?  This condition may be diagnosed based on:  · Your symptoms and medical history.  · A physical exam. During the exam, you may be asked to put your hands in cold water to check for a reaction to cold temperature.  · Tests, such as:  ? Blood tests to check for other diseases or conditions.  ? A test to check the movement of blood through your arteries and veins (vascular ultrasound).  ? A test in which the skin at the base of your fingernail is examined under a microscope (nailfold capillaroscopy).  How is this treated?  Treatment for this condition often involves making lifestyle changes and taking steps to control your exposure to cold temperatures. For more severe cases, medicine (calcium channel blockers) may be used to improve blood flow. Surgery is sometimes done to block the nerves that control the affected arteries, but this is rare.  Follow these instructions at home:  Avoiding cold temperatures  Take these steps to avoid exposure to cold:  · If possible, stay indoors during cold weather.  · When you   go outside during cold weather, dress in layers and wear mittens, a hat, a scarf, and warm footwear.  · Wear mittens or gloves when handling ice or frozen food.  · Use holders for glasses or cans containing cold drinks.  · Let warm water run for a while before taking a shower or bath.  · Warm up the car before driving in cold weather.  Lifestyle    · If possible, avoid stressful and emotional situations. Try to find ways to manage your stress, such as:  ? Exercise.  ? Yoga.  ? Meditation.  ? Biofeedback.  · Do not use any  products that contain nicotine or tobacco, such as cigarettes and e-cigarettes. If you need help quitting, ask your health care provider.  · Avoid secondhand smoke.  · Limit your use of caffeine.  ? Switch to decaffeinated coffee, tea, and soda.  ? Avoid chocolate.  · Avoid vibrating tools and machinery.  General instructions  · Protect your hands and feet from injuries, cuts, or bruises.  · Avoid wearing tight rings or wristbands.  · Wear loose fitting socks and comfortable, roomy shoes.  · Take over-the-counter and prescription medicines only as told by your health care provider.  Contact a health care provider if:  · Your discomfort becomes worse despite lifestyle changes.  · You develop sores on your fingers or toes that do not heal.  · Your fingers or toes turn black.  · You have breaks in the skin on your fingers or toes.  · You have a fever.  · You have pain or swelling in your joints.  · You have a rash.  · Your symptoms occur on only one side of your body.  Summary  · Raynaud phenomenon is a condition that affects the arteries that carry blood to your fingers, toes, ears, lips, nipples, or the tip of your nose.  · In many cases, the cause of this condition is not known.  · Symptoms of this condition include changes in skin color, and numbness and tingling of the affected area.  · Treatment for this condition includes lifestyle changes, reducing exposure to cold temperatures, and using medicines for severe cases of the condition.  · Contact your health care provider if your condition worsens despite treatment.  This information is not intended to replace advice given to you by your health care provider. Make sure you discuss any questions you have with your health care provider.  Document Released: 11/23/2000 Document Revised: 06/11/2017 Document Reviewed: 01/07/2017  Elsevier Interactive Patient Education © 2019 [employer]

## 2019-02-25 NOTE — Assessment & Plan Note (Signed)
Would likely increase vasospastic disorder

## 2019-08-14 ENCOUNTER — Other Ambulatory Visit: Payer: Self-pay

## 2019-08-14 ENCOUNTER — Encounter (INDEPENDENT_AMBULATORY_CARE_PROVIDER_SITE_OTHER): Payer: Self-pay

## 2019-08-14 ENCOUNTER — Other Ambulatory Visit: Payer: Self-pay | Admitting: Gerontology

## 2019-08-14 ENCOUNTER — Ambulatory Visit: Payer: BC Managed Care – PPO

## 2019-08-14 DIAGNOSIS — G629 Polyneuropathy, unspecified: Secondary | ICD-10-CM

## 2024-02-08 ENCOUNTER — Other Ambulatory Visit: Payer: Self-pay

## 2024-02-08 ENCOUNTER — Emergency Department

## 2024-02-08 ENCOUNTER — Encounter: Payer: Self-pay | Admitting: Intensive Care

## 2024-02-08 ENCOUNTER — Emergency Department
Admission: EM | Admit: 2024-02-08 | Discharge: 2024-02-08 | Disposition: A | Discharge status: Home / Self Care | Attending: Emergency Medicine | Admitting: Emergency Medicine

## 2024-02-08 DIAGNOSIS — S50811A Abrasion of right forearm, initial encounter: Secondary | ICD-10-CM | POA: Insufficient documentation

## 2024-02-08 DIAGNOSIS — S60512A Abrasion of left hand, initial encounter: Secondary | ICD-10-CM | POA: Insufficient documentation

## 2024-02-08 DIAGNOSIS — S01112A Laceration without foreign body of left eyelid and periocular area, initial encounter: Secondary | ICD-10-CM | POA: Diagnosis not present

## 2024-02-08 DIAGNOSIS — S0990XA Unspecified injury of head, initial encounter: Secondary | ICD-10-CM | POA: Diagnosis present

## 2024-02-08 DIAGNOSIS — Z23 Encounter for immunization: Secondary | ICD-10-CM | POA: Diagnosis not present

## 2024-02-08 DIAGNOSIS — T148XXA Other injury of unspecified body region, initial encounter: Secondary | ICD-10-CM

## 2024-02-08 MED ORDER — TETANUS-DIPHTH-ACELL PERTUSSIS 5-2.5-18.5 LF-MCG/0.5 IM SUSY
0.5000 mL | PREFILLED_SYRINGE | Freq: Once | INTRAMUSCULAR | Status: AC
  Administered 2024-02-08: 0.5 mL via INTRAMUSCULAR
  Filled 2024-02-08: qty 0.5

## 2024-02-08 MED ORDER — ACETAMINOPHEN 325 MG PO TABS
650.0000 mg | ORAL_TABLET | Freq: Once | ORAL | Status: AC
  Administered 2024-02-08: 650 mg via ORAL
  Filled 2024-02-08: qty 2
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
## 2024-12-01 ENCOUNTER — Other Ambulatory Visit: Payer: Self-pay | Admitting: Cardiology

## 2024-12-01 DIAGNOSIS — I428 Other cardiomyopathies: Secondary | ICD-10-CM

## 2024-12-01 DIAGNOSIS — I10 Essential (primary) hypertension: Secondary | ICD-10-CM

## 2024-12-01 DIAGNOSIS — E782 Mixed hyperlipidemia: Secondary | ICD-10-CM

## 2024-12-01 DIAGNOSIS — R0609 Other forms of dyspnea: Secondary | ICD-10-CM

## 2024-12-01 DIAGNOSIS — Z8249 Family history of ischemic heart disease and other diseases of the circulatory system: Secondary | ICD-10-CM

## 2024-12-01 DIAGNOSIS — J449 Chronic obstructive pulmonary disease, unspecified: Secondary | ICD-10-CM

## 2024-12-01 DIAGNOSIS — F17200 Nicotine dependence, unspecified, uncomplicated: Secondary | ICD-10-CM

## 2024-12-22 ENCOUNTER — Encounter (HOSPITAL_COMMUNITY): Payer: Self-pay

## 2024-12-24 ENCOUNTER — Ambulatory Visit
Admission: RE | Admit: 2024-12-24 | Discharge: 2024-12-24 | Disposition: A | Discharge status: Home / Self Care | Source: Ambulatory Visit | Attending: Cardiology | Admitting: Cardiology

## 2024-12-24 DIAGNOSIS — Z8249 Family history of ischemic heart disease and other diseases of the circulatory system: Secondary | ICD-10-CM | POA: Insufficient documentation

## 2024-12-24 DIAGNOSIS — F17200 Nicotine dependence, unspecified, uncomplicated: Secondary | ICD-10-CM | POA: Insufficient documentation

## 2024-12-24 DIAGNOSIS — I10 Essential (primary) hypertension: Secondary | ICD-10-CM | POA: Diagnosis present

## 2024-12-24 DIAGNOSIS — E782 Mixed hyperlipidemia: Secondary | ICD-10-CM | POA: Diagnosis present

## 2024-12-24 DIAGNOSIS — I428 Other cardiomyopathies: Secondary | ICD-10-CM | POA: Insufficient documentation

## 2024-12-24 DIAGNOSIS — J449 Chronic obstructive pulmonary disease, unspecified: Secondary | ICD-10-CM | POA: Diagnosis present

## 2024-12-24 DIAGNOSIS — R0609 Other forms of dyspnea: Secondary | ICD-10-CM | POA: Diagnosis present

## 2024-12-24 MED ORDER — IOHEXOL 350 MG/ML SOLN
100.0000 mL | Freq: Once | INTRAVENOUS | Status: AC | PRN
  Administered 2024-12-24: 100 mL via INTRAVENOUS

## 2024-12-24 MED ORDER — METOPROLOL TARTRATE 5 MG/5ML IV SOLN
INTRAVENOUS | Status: AC
  Filled 2024-12-24: qty 10

## 2024-12-24 MED ORDER — DILTIAZEM HCL 25 MG/5ML IV SOLN: 10.0000 mg | INTRAVENOUS | Status: DC | PRN

## 2024-12-24 MED ORDER — NITROGLYCERIN 0.4 MG SL SUBL
0.8000 mg | SUBLINGUAL_TABLET | Freq: Once | SUBLINGUAL | Status: AC
  Administered 2024-12-24: 0.8 mg via SUBLINGUAL
  Filled 2024-12-24: qty 25

## 2024-12-24 MED ORDER — METOPROLOL TARTRATE 5 MG/5ML IV SOLN
10.0000 mg | Freq: Once | INTRAVENOUS | Status: AC | PRN
  Administered 2024-12-24: 10 mg via INTRAVENOUS
